# Patient Record
Sex: Female | Born: 1950 | Race: White | Hispanic: No | Marital: Married | Smoking: Never smoker
Health system: Southern US, Community
[De-identification: ages and names within clinical notes are randomized; demographics above are authoritative.]

## PROBLEM LIST (undated history)

## (undated) DIAGNOSIS — E785 Hyperlipidemia, unspecified: Secondary | ICD-10-CM

## (undated) DIAGNOSIS — B009 Herpesviral infection, unspecified: Secondary | ICD-10-CM

## (undated) DIAGNOSIS — R51 Headache: Secondary | ICD-10-CM

## (undated) DIAGNOSIS — M1611 Unilateral primary osteoarthritis, right hip: Secondary | ICD-10-CM

## (undated) DIAGNOSIS — F419 Anxiety disorder, unspecified: Secondary | ICD-10-CM

## (undated) DIAGNOSIS — H269 Unspecified cataract: Secondary | ICD-10-CM

## (undated) DIAGNOSIS — S83231A Complex tear of medial meniscus, current injury, right knee, initial encounter: Secondary | ICD-10-CM

## (undated) DIAGNOSIS — S83106A Unspecified dislocation of unspecified knee, initial encounter: Secondary | ICD-10-CM

## (undated) DIAGNOSIS — Z98811 Dental restoration status: Secondary | ICD-10-CM

## (undated) DIAGNOSIS — M810 Age-related osteoporosis without current pathological fracture: Secondary | ICD-10-CM

## (undated) DIAGNOSIS — Z8489 Family history of other specified conditions: Secondary | ICD-10-CM

## (undated) DIAGNOSIS — M1711 Unilateral primary osteoarthritis, right knee: Secondary | ICD-10-CM

## (undated) DIAGNOSIS — I10 Essential (primary) hypertension: Secondary | ICD-10-CM

## (undated) DIAGNOSIS — U071 COVID-19: Secondary | ICD-10-CM

## (undated) DIAGNOSIS — G500 Trigeminal neuralgia: Secondary | ICD-10-CM

## (undated) DIAGNOSIS — Z973 Presence of spectacles and contact lenses: Secondary | ICD-10-CM

## (undated) HISTORY — DX: Hyperlipidemia, unspecified: E78.5

## (undated) HISTORY — PX: JOINT REPLACEMENT: SHX530

## (undated) HISTORY — DX: Age-related osteoporosis without current pathological fracture: M81.0

## (undated) HISTORY — PX: COLONOSCOPY: SHX174

## (undated) HISTORY — DX: Unspecified cataract: H26.9

## (undated) HISTORY — PX: BRAIN SURGERY: SHX531

## (undated) HISTORY — PX: TUBAL LIGATION: SHX77

## (undated) HISTORY — PX: OTHER SURGICAL HISTORY: SHX169

---

## 1984-01-04 HISTORY — PX: ABDOMINAL HYSTERECTOMY: SHX81

## 2004-04-03 HISTORY — PX: TRIGEMINAL NERVE DECOMPRESSION: SHX2579

## 2011-12-04 DIAGNOSIS — S83106A Unspecified dislocation of unspecified knee, initial encounter: Secondary | ICD-10-CM

## 2011-12-04 HISTORY — DX: Unspecified dislocation of unspecified knee, initial encounter: S83.106A

## 2011-12-23 ENCOUNTER — Encounter (HOSPITAL_BASED_OUTPATIENT_CLINIC_OR_DEPARTMENT_OTHER): Payer: Self-pay | Admitting: *Deleted

## 2011-12-29 ENCOUNTER — Other Ambulatory Visit: Payer: Self-pay | Admitting: Orthopedic Surgery

## 2011-12-30 ENCOUNTER — Ambulatory Visit (HOSPITAL_BASED_OUTPATIENT_CLINIC_OR_DEPARTMENT_OTHER): Payer: Commercial Managed Care - PPO | Admitting: Anesthesiology

## 2011-12-30 ENCOUNTER — Encounter (HOSPITAL_BASED_OUTPATIENT_CLINIC_OR_DEPARTMENT_OTHER): Payer: Self-pay | Admitting: Anesthesiology

## 2011-12-30 ENCOUNTER — Encounter (HOSPITAL_BASED_OUTPATIENT_CLINIC_OR_DEPARTMENT_OTHER)
Admission: RE | Disposition: A | Discharge status: Home / Self Care | Payer: Self-pay | Source: Ambulatory Visit | Attending: Orthopedic Surgery

## 2011-12-30 ENCOUNTER — Encounter (HOSPITAL_BASED_OUTPATIENT_CLINIC_OR_DEPARTMENT_OTHER): Payer: Self-pay | Admitting: Orthopedic Surgery

## 2011-12-30 ENCOUNTER — Ambulatory Visit (HOSPITAL_BASED_OUTPATIENT_CLINIC_OR_DEPARTMENT_OTHER)
Admission: RE | Admit: 2011-12-30 | Discharge: 2011-12-30 | Disposition: A | Discharge status: Home / Self Care | Payer: Commercial Managed Care - PPO | Source: Ambulatory Visit | Attending: Orthopedic Surgery | Admitting: Orthopedic Surgery

## 2011-12-30 ENCOUNTER — Encounter (HOSPITAL_BASED_OUTPATIENT_CLINIC_OR_DEPARTMENT_OTHER): Payer: Self-pay | Admitting: *Deleted

## 2011-12-30 DIAGNOSIS — M9420 Chondromalacia, unspecified site: Secondary | ICD-10-CM | POA: Insufficient documentation

## 2011-12-30 DIAGNOSIS — M23329 Other meniscus derangements, posterior horn of medial meniscus, unspecified knee: Secondary | ICD-10-CM | POA: Insufficient documentation

## 2011-12-30 DIAGNOSIS — M1711 Unilateral primary osteoarthritis, right knee: Secondary | ICD-10-CM | POA: Diagnosis present

## 2011-12-30 DIAGNOSIS — Z90710 Acquired absence of both cervix and uterus: Secondary | ICD-10-CM | POA: Insufficient documentation

## 2011-12-30 DIAGNOSIS — S83231A Complex tear of medial meniscus, current injury, right knee, initial encounter: Secondary | ICD-10-CM | POA: Diagnosis present

## 2011-12-30 HISTORY — DX: Complex tear of medial meniscus, current injury, right knee, initial encounter: S83.231A

## 2011-12-30 HISTORY — DX: Dental restoration status: Z98.811

## 2011-12-30 HISTORY — DX: Unilateral primary osteoarthritis, right knee: M17.11

## 2011-12-30 HISTORY — PX: KNEE ARTHROSCOPY WITH MEDIAL MENISECTOMY: SHX5651

## 2011-12-30 HISTORY — DX: Headache: R51

## 2011-12-30 HISTORY — DX: Unspecified dislocation of unspecified knee, initial encounter: S83.106A

## 2011-12-30 SURGERY — ARTHROSCOPY, KNEE, WITH MEDIAL MENISCECTOMY
Anesthesia: General | Site: Knee | Laterality: Right | Wound class: Clean

## 2011-12-30 MED ORDER — MIDAZOLAM HCL 5 MG/5ML IJ SOLN
INTRAMUSCULAR | Status: DC | PRN
  Administered 2011-12-30: 2 mg via INTRAVENOUS

## 2011-12-30 MED ORDER — KETOROLAC TROMETHAMINE 10 MG PO TABS: 10.0000 mg | ORAL_TABLET | Freq: Four times a day (QID) | ORAL | Status: DC | PRN

## 2011-12-30 MED ORDER — CEFAZOLIN SODIUM-DEXTROSE 2-3 GM-% IV SOLR
2.0000 g | INTRAVENOUS | Status: AC
  Administered 2011-12-30: 2 g via INTRAVENOUS

## 2011-12-30 MED ORDER — LACTATED RINGERS IV SOLN
INTRAVENOUS | Status: DC
Start: 2011-12-30 — End: 2011-12-30
  Administered 2011-12-30 (×2): via INTRAVENOUS

## 2011-12-30 MED ORDER — OXYCODONE-ACETAMINOPHEN 5-325 MG PO TABS: 1.0000 | ORAL_TABLET | Freq: Four times a day (QID) | ORAL | Status: DC | PRN

## 2011-12-30 MED ORDER — ONDANSETRON HCL 4 MG/2ML IJ SOLN
INTRAMUSCULAR | Status: DC | PRN
  Administered 2011-12-30: 4 mg via INTRAVENOUS

## 2011-12-30 MED ORDER — KETOROLAC TROMETHAMINE 30 MG/ML IJ SOLN
30.0000 mg | Freq: Once | INTRAMUSCULAR | Status: AC
  Administered 2011-12-30: 30 mg via INTRAVENOUS

## 2011-12-30 MED ORDER — FENTANYL CITRATE 0.05 MG/ML IJ SOLN: 50.0000 ug | INTRAMUSCULAR | Status: DC | PRN

## 2011-12-30 MED ORDER — OXYCODONE HCL 5 MG PO TABS: 5.0000 mg | ORAL_TABLET | Freq: Once | ORAL | Status: DC | PRN

## 2011-12-30 MED ORDER — PROMETHAZINE HCL 25 MG PO TABS: 25.0000 mg | ORAL_TABLET | Freq: Four times a day (QID) | ORAL | Status: DC | PRN

## 2011-12-30 MED ORDER — DEXAMETHASONE SODIUM PHOSPHATE 4 MG/ML IJ SOLN
INTRAMUSCULAR | Status: DC | PRN
  Administered 2011-12-30: 10 mg via INTRAVENOUS

## 2011-12-30 MED ORDER — FENTANYL CITRATE 0.05 MG/ML IJ SOLN
INTRAMUSCULAR | Status: DC | PRN
  Administered 2011-12-30: 100 ug via INTRAVENOUS

## 2011-12-30 MED ORDER — MIDAZOLAM HCL 2 MG/2ML IJ SOLN: 1.0000 mg | INTRAMUSCULAR | Status: DC | PRN

## 2011-12-30 MED ORDER — HYDROMORPHONE HCL PF 1 MG/ML IJ SOLN
0.2500 mg | INTRAMUSCULAR | Status: DC | PRN
  Administered 2011-12-30 (×3): 0.5 mg via INTRAVENOUS

## 2011-12-30 MED ORDER — OXYCODONE HCL 5 MG/5ML PO SOLN: 5.0000 mg | Freq: Once | ORAL | Status: DC | PRN

## 2011-12-30 MED ORDER — BUPIVACAINE HCL (PF) 0.5 % IJ SOLN
INTRAMUSCULAR | Status: DC | PRN
  Administered 2011-12-30: 20 mL

## 2011-12-30 MED ORDER — PROPOFOL 10 MG/ML IV BOLUS
INTRAVENOUS | Status: DC | PRN
  Administered 2011-12-30: 300 mg via INTRAVENOUS

## 2011-12-30 MED ORDER — SODIUM CHLORIDE 0.9 % IR SOLN
Status: DC | PRN
  Administered 2011-12-30: 3000 mL

## 2011-12-30 MED ORDER — ONDANSETRON HCL 4 MG/2ML IJ SOLN: 4.0000 mg | Freq: Once | INTRAMUSCULAR | Status: DC | PRN

## 2011-12-30 MED ORDER — LIDOCAINE HCL (CARDIAC) 20 MG/ML IV SOLN
INTRAVENOUS | Status: DC | PRN
  Administered 2011-12-30: 75 mg via INTRAVENOUS

## 2011-12-30 MED ORDER — SENNA-DOCUSATE SODIUM 8.6-50 MG PO TABS: 1.0000 | ORAL_TABLET | Freq: Every day | ORAL | Status: DC

## 2011-12-30 MED ORDER — EPHEDRINE SULFATE 50 MG/ML IJ SOLN
INTRAMUSCULAR | Status: DC | PRN
  Administered 2011-12-30: 10 mg via INTRAVENOUS

## 2011-12-30 SURGICAL SUPPLY — 37 items
BANDAGE ELASTIC 6 VELCRO ST LF (GAUZE/BANDAGES/DRESSINGS) ×2 IMPLANT
BANDAGE ESMARK 6X9 LF (GAUZE/BANDAGES/DRESSINGS) IMPLANT
BENZOIN TINCTURE PRP APPL 2/3 (GAUZE/BANDAGES/DRESSINGS) ×2 IMPLANT
BLADE CUTTER GATOR 3.5 (BLADE) ×2 IMPLANT
BNDG ESMARK 6X9 LF (GAUZE/BANDAGES/DRESSINGS)
CANISTER OMNI JUG 16 LITER (MISCELLANEOUS) ×2 IMPLANT
CANISTER SUCTION 2500CC (MISCELLANEOUS) IMPLANT
CLOTH BEACON ORANGE TIMEOUT ST (SAFETY) ×2 IMPLANT
CUFF TOURNIQUET SINGLE 34IN LL (TOURNIQUET CUFF) IMPLANT
CUTTER KNOT PUSHER 2-0 FIBERWI (INSTRUMENTS) IMPLANT
CUTTER MENISCUS  4.2MM (BLADE)
CUTTER MENISCUS 4.2MM (BLADE) IMPLANT
DRAPE ARTHROSCOPY W/POUCH 90 (DRAPES) ×2 IMPLANT
DURAPREP 26ML APPLICATOR (WOUND CARE) ×2 IMPLANT
ELECT MENISCUS 165MM 90D (ELECTRODE) IMPLANT
ELECT REM PT RETURN 9FT ADLT (ELECTROSURGICAL)
ELECTRODE REM PT RTRN 9FT ADLT (ELECTROSURGICAL) IMPLANT
GLOVE BIO SURGEON STRL SZ8 (GLOVE) ×2 IMPLANT
GLOVE BIOGEL PI IND STRL 8 (GLOVE) ×2 IMPLANT
GLOVE BIOGEL PI INDICATOR 8 (GLOVE) ×2
GLOVE ORTHO TXT STRL SZ7.5 (GLOVE) ×2 IMPLANT
GOWN BRE IMP PREV XXLGXLNG (GOWN DISPOSABLE) ×4 IMPLANT
HOLDER KNEE FOAM BLUE (MISCELLANEOUS) ×2 IMPLANT
KNEE WRAP E Z 3 GEL PACK (MISCELLANEOUS) ×2 IMPLANT
PACK ARTHROSCOPY DSU (CUSTOM PROCEDURE TRAY) ×2 IMPLANT
PACK BASIN DAY SURGERY FS (CUSTOM PROCEDURE TRAY) ×2 IMPLANT
PENCIL BUTTON HOLSTER BLD 10FT (ELECTRODE) IMPLANT
SET ARTHROSCOPY TUBING (MISCELLANEOUS) ×1
SET ARTHROSCOPY TUBING LN (MISCELLANEOUS) ×1 IMPLANT
SLEEVE SCD COMPRESS KNEE MED (MISCELLANEOUS) IMPLANT
SPONGE GAUZE 4X4 12PLY (GAUZE/BANDAGES/DRESSINGS) ×2 IMPLANT
STRIP CLOSURE SKIN 1/2X4 (GAUZE/BANDAGES/DRESSINGS) ×2 IMPLANT
SUT MNCRL AB 4-0 PS2 18 (SUTURE) ×2 IMPLANT
TOWEL OR 17X24 6PK STRL BLUE (TOWEL DISPOSABLE) ×2 IMPLANT
TOWEL OR NON WOVEN STRL DISP B (DISPOSABLE) ×2 IMPLANT
WAND STAR VAC 90 (SURGICAL WAND) IMPLANT
WATER STERILE IRR 1000ML POUR (IV SOLUTION) ×2 IMPLANT

## 2011-12-30 NOTE — Anesthesia Postprocedure Evaluation (Signed)
  Anesthesia Post-op Note  Patient: Laura Wright  Procedure(s) Performed: Procedure(s) (LRB) with comments: KNEE ARTHROSCOPY WITH MEDIAL MENISECTOMY (Right) - RIGHT KNEE SCOPE Partial MEDIAL MENISCECTOMY  Patient Location: PACU  Anesthesia Type:General  Level of Consciousness: awake, alert  and oriented  Airway and Oxygen Therapy: Patient Spontanous Breathing and Patient connected to face mask oxygen  Post-op Pain: mild  Post-op Assessment: Post-op Vital signs reviewed  Post-op Vital Signs: Reviewed  Complications: No apparent anesthesia complications

## 2011-12-30 NOTE — Op Note (Signed)
12/30/2011  9:00 AM  PATIENT:  [name]    PRE-OPERATIVE DIAGNOSIS:  Right knee medial meniscus tear, early degenerative changes  POST-OPERATIVE DIAGNOSIS:  Right knee complex medial meniscus tear from the posterior horn to the mid substance of the body with grade 4 chondral loss on the femur and the tibia on the medial side.  PROCEDURE:  KNEE ARTHROSCOPY WITH MEDIAL MENISECTOMY  SURGEON:  Eulas Post, MD  PHYSICIAN ASSISTANT: Janace Litten, OPA-C, present and scrubbed throughout the case, critical for completion in a timely fashion, and for retraction, instrumentation, and closure.  ANESTHESIA:   General  PREOPERATIVE INDICATIONS:  Laura Wright is a  61 y.o. female with a diagnosis of right knee medial meniscus tear who failed conservative treatment. We did discuss the option of possible medial compartment arthroplasty given the fact that there were degenerative changes seen on the MRI and on the plain films, however she elected for arthroscopic intervention in hopes of putting off knee arthroplasty. She failed conservative measures including injections, bracing, activity modification, and anti-inflammatories, and elected for surgical management.    The risks benefits and alternatives were discussed with the patient preoperatively including but not limited to the risks of infection, bleeding, nerve injury, cardiopulmonary complications, the need for revision surgery, among others, and the patient was willing to proceed.  OPERATIVE IMPLANTS: None  OPERATIVE FINDINGS: The undersurface of the patella was reasonably well maintained. The femoral trochlea was intact with some grade 1 changes. The knee overall was fairly tight. The medial compartment had a complex tear the mid substance of the body of the medial meniscus, with significant degenerative tissue. There was extensive uncontained grade 4 chondral loss on the tibia as well as a matching lesion on the femur. The anterior  cruciate ligament was intact and took on reasonably good tension. The lateral compartment was intact with only some very minimal central fraying of the meniscus, which was not even easily accessed because the knee was so tight, and was not debrided.  OPERATIVE PROCEDURE: The patient was brought to the operating room and placed in supine position. General anesthesia was administered. IV antibiotics were given. The right lower extremity was prepped and draped in usual sterile fashion. Time out was performed. Diagnostic arthroscopy was carried out with the above-named findings. I used the arthroscopic scopic shaver to debride the meniscus. I didn't even need to basket because the meniscal tissue was so degenerative it was easily removed with the shaver. Once I had completed the mastectomy, and removed any chondral debris, the instruments were removed, and the knee injected and the portals closed with Monocryl followed by Steri-Strips and sterile gauze. She was awakened and returned to the PACU in stable and satisfactory condition. There were no complications.

## 2011-12-30 NOTE — Transfer of Care (Signed)
Immediate Anesthesia Transfer of Care Note  Patient: Laura Wright  Procedure(s) Performed: Procedure(s) (LRB) with comments: KNEE ARTHROSCOPY WITH MEDIAL MENISECTOMY (Right) - RIGHT KNEE SCOPE Partial MEDIAL MENISCECTOMY  Patient Location: PACU  Anesthesia Type:General  Level of Consciousness: awake, alert  and oriented  Airway & Oxygen Therapy: Patient Spontanous Breathing and Patient connected to nasal cannula oxygen  Post-op Assessment: Report given to PACU RN and Post -op Vital signs reviewed and stable  Post vital signs: Reviewed and stable  Complications: No apparent anesthesia complications

## 2011-12-30 NOTE — Anesthesia Preprocedure Evaluation (Signed)

## 2011-12-30 NOTE — Anesthesia Procedure Notes (Signed)
Procedure Name: LMA Insertion Date/Time: 12/30/2011 8:26 AM Performed by: Zenia Resides D Pre-anesthesia Checklist: Patient identified, Emergency Drugs available, Suction available and Patient being monitored Patient Re-evaluated:Patient Re-evaluated prior to inductionOxygen Delivery Method: Circle System Utilized Preoxygenation: Pre-oxygenation with 100% oxygen Intubation Type: IV induction Ventilation: Mask ventilation without difficulty LMA: LMA inserted LMA Size: 4.0 Number of attempts: 1 Airway Equipment and Method: bite block Placement Confirmation: positive ETCO2 and breath sounds checked- equal and bilateral Tube secured with: Tape Dental Injury: Teeth and Oropharynx as per pre-operative assessment

## 2011-12-30 NOTE — H&P (Signed)
PREOPERATIVE H&P  Chief Complaint: RIGHT KNEE; TEAR OF MEDIAL CARTILAGE OR MENISCUS OF KNEE CURRENT  HPI: Laura Wright is a 61 y.o. female who presents for preoperative history and physical with a diagnosis of RIGHT KNEE; TEAR OF MEDIAL CARTILAGE OR MENISCUS OF KNEE CURRENT. Symptoms are rated as moderate to severe, and have been worsening.  This is significantly impairing activities of daily living.  She has elected for surgical management. We have tried injections, bracing, therapy/home exercise program, anti-inflammatories without relief.  Past Medical History  Diagnosis Date  . Headache     due to trigeminal neuralgia and trauma of surgery  . Knee dislocation 12/2011    right  . Dental crowns present     x 2   Past Surgical History  Procedure Date  . Abdominal hysterectomy 1986    partial  . Trigeminal nerve decompression 04/2004   History   Social History  . Marital Status: Married    Spouse Name: N/A    Number of Children: N/A  . Years of Education: N/A   Social History Main Topics  . Smoking status: Never Smoker   . Smokeless tobacco: Never Used  . Alcohol Use: No  . Drug Use: No  . Sexually Active:    Other Topics Concern  . None   Social History Narrative  . None   Family History  Problem Relation Age of Onset  . Anesthesia problems Daughter     post-op N/V   Allergies  Allergen Reactions  . Macrodantin (Nitrofurantoin Macrocrystal) Rash   Prior to Admission medications   Medication Sig Start Date End Date Taking? Authorizing Provider  calcium carbonate (OS-CAL) 600 MG TABS Take 600 mg by mouth 2 (two) times daily with a meal.   Yes Historical Provider, MD  Multiple Vitamin (MULTIVITAMIN) tablet Take 1 tablet by mouth daily.   Yes Historical Provider, MD  thiamine (VITAMIN B-1) 100 MG tablet Take 100 mg by mouth daily.   Yes Historical Provider, MD  valACYclovir (VALTREX) 500 MG tablet Take 500 mg by mouth 2 (two) times daily.   Yes Historical  Provider, MD  vitamin C (ASCORBIC ACID) 500 MG tablet Take 500 mg by mouth daily.   Yes Historical Provider, MD  vitamin E 100 UNIT capsule Take 100 Units by mouth daily.   Yes Historical Provider, MD     Positive ROS: All other systems have been reviewed and were otherwise negative with the exception of those mentioned in the HPI and as above.  Physical Exam: General: Alert, no acute distress Cardiovascular: No pedal edema Respiratory: No cyanosis, no use of accessory musculature GI: No organomegaly, abdomen is soft and non-tender Skin: No lesions in the area of chief complaint Neurologic: Sensation intact distally Psychiatric: Patient is competent for consent with normal mood and affect Lymphatic: No axillary or cervical lymphadenopathy  MUSCULOSKELETAL: Right knee has positive medial joint line pain. Minimal effusion. Minimal pain laterally. No true locking.  Assessment: Right knee medial meniscus tear  Plan: Plan for Procedure(s): KNEE ARTHROSCOPY WITH MEDIAL MENISECTOMY  The risks benefits and alternatives were discussed with the patient including but not limited to the risks of nonoperative treatment, versus surgical intervention including infection, bleeding, nerve injury,  blood clots, cardiopulmonary complications, morbidity, mortality, among others, and they were willing to proceed. We have also discussed the risks for progression of arthritis, incomplete relief of symptoms, persistent pain, among others.  Eulas Post, MD Cell [phone] Pager [phone]  12/30/2011  7:00 AM

## 2012-01-02 ENCOUNTER — Encounter (HOSPITAL_BASED_OUTPATIENT_CLINIC_OR_DEPARTMENT_OTHER): Payer: Self-pay | Admitting: Orthopedic Surgery

## 2012-12-18 ENCOUNTER — Encounter (HOSPITAL_COMMUNITY): Payer: Self-pay | Admitting: Pharmacy Technician

## 2012-12-25 ENCOUNTER — Encounter (HOSPITAL_COMMUNITY): Payer: Self-pay

## 2012-12-25 ENCOUNTER — Ambulatory Visit (HOSPITAL_COMMUNITY)
Admission: RE | Admit: 2012-12-25 | Discharge: 2012-12-25 | Disposition: A | Discharge status: Home / Self Care | Payer: Commercial Managed Care - PPO | Source: Ambulatory Visit | Attending: Orthopedic Surgery | Admitting: Orthopedic Surgery

## 2012-12-25 ENCOUNTER — Other Ambulatory Visit (HOSPITAL_COMMUNITY): Payer: Self-pay | Admitting: *Deleted

## 2012-12-25 ENCOUNTER — Encounter (HOSPITAL_COMMUNITY)
Admission: RE | Admit: 2012-12-25 | Discharge: 2012-12-25 | Disposition: A | Discharge status: Home / Self Care | Payer: Commercial Managed Care - PPO | Source: Ambulatory Visit | Attending: Orthopedic Surgery | Admitting: Orthopedic Surgery

## 2012-12-25 DIAGNOSIS — Z01812 Encounter for preprocedural laboratory examination: Secondary | ICD-10-CM | POA: Insufficient documentation

## 2012-12-25 DIAGNOSIS — Z01810 Encounter for preprocedural cardiovascular examination: Secondary | ICD-10-CM | POA: Insufficient documentation

## 2012-12-25 HISTORY — DX: Essential (primary) hypertension: I10

## 2012-12-25 HISTORY — DX: Trigeminal neuralgia: G50.0

## 2012-12-25 HISTORY — DX: Herpesviral infection, unspecified: B00.9

## 2012-12-25 HISTORY — DX: Anxiety disorder, unspecified: F41.9

## 2012-12-25 LAB — BASIC METABOLIC PANEL
BUN: 11 mg/dL (ref 6–23)
CO2: 28 mEq/L (ref 19–32)
Chloride: 101 mEq/L (ref 96–112)
Creatinine, Ser: 0.88 mg/dL (ref 0.50–1.10)
Glucose, Bld: 89 mg/dL (ref 70–99)

## 2012-12-25 LAB — CBC
HCT: 42.5 % (ref 36.0–46.0)
MCHC: 32.9 g/dL (ref 30.0–36.0)
MCV: 87.4 fL (ref 78.0–100.0)
RDW: 13.5 % (ref 11.5–15.5)

## 2012-12-25 LAB — ABO/RH: ABO/RH(D): O POS

## 2012-12-25 LAB — TYPE AND SCREEN: ABO/RH(D): O POS

## 2012-12-25 NOTE — Progress Notes (Signed)
No orders in EPIC from Dr. Dion Saucier. Called his office and spoke with Cordelia Pen and she states she will get a message to him.

## 2012-12-25 NOTE — Pre-Procedure Instructions (Signed)
Laura Wright  12/25/2012   Your procedure is scheduled on:  Tuesday, January 01, 2013 at 7:30 AM.   Report to [facility] Entrance "A" Admitting Office at 5:30 AM.   Call this number if you have problems the morning of surgery: [phone]   Remember:   Do not eat food or drink liquids after midnight Monday, 12/31/12.   Take these medicines the morning of surgery with A SIP OF WATER: valACYclovir (VALTREX), clonazePAM (KLONOPIN) - if needed.  Stop Vitamin E and Toradol (Ketorolac) as of today, 12/25/12.     Do not wear jewelry, make-up or nail polish.  Do not wear lotions, powders, or perfumes. You may wear deodorant.  Do not shave 48 hours prior to surgery.   Do not bring valuables to the hospital.  [facility] is not responsible                  for any belongings or valuables.               Contacts, dentures or bridgework may not be worn into surgery.  Leave suitcase in the car. After surgery it may be brought to your room.  For patients admitted to the hospital, discharge time is determined by your                treatment team.         Special Instructions: Shower using CHG 2 nights before surgery and the night before surgery.  If you shower the day of surgery use CHG.  Use special wash - you have one bottle of CHG for all showers.  You should use approximately 1/3 of the bottle for each shower.   Please read over the following fact sheets that you were given: Pain Booklet, Coughing and Deep Breathing, Blood Transfusion Information, MRSA Information and Surgical Site Infection Prevention

## 2012-12-26 NOTE — Progress Notes (Signed)
Anesthesia Chart Review:  Patient is a 62 year old female scheduled for unicompartmental knee, right on 01/01/13 by Dr. Dion Saucier.  History includes non-smoker, HTN, anxiety, trigeminal neuralgia, headaches, hysterectomy. PCP is listed as Dr. Brooke Bonito.  EKG on 12/25/12 showed NSR, low voltage QRS, cannot rule out anterior infarct (age undetermined).  Currently, there are no comparison EKGs available. No CV symptoms were documented at her PAT visit.  Preoperative CXR and labs noted. Orders were pending at that time of her PAT visit, so additional lab orders, if any, will have to be done on the day of surgery.  She will be evaluated by her assigned anesthesiologist on the day of surgery.  If no acute changes or new CV symptomology then I would anticipate that she could proceed as planned.  Velna Ochs [facility] Short Stay Center/Anesthesiology Phone [phone] 12/26/2012 10:04 AM

## 2012-12-31 ENCOUNTER — Other Ambulatory Visit: Payer: Self-pay | Admitting: Orthopedic Surgery

## 2012-12-31 MED ORDER — CEFAZOLIN SODIUM-DEXTROSE 2-3 GM-% IV SOLR
2.0000 g | INTRAVENOUS | Status: AC
  Administered 2013-01-01: 2 g via INTRAVENOUS
  Filled 2012-12-31: qty 50

## 2012-12-31 NOTE — Progress Notes (Signed)
Spoke with Tresa Endo at Dr. Shelba Flake office to request orders.

## 2013-01-01 ENCOUNTER — Encounter (HOSPITAL_COMMUNITY): Payer: Self-pay | Admitting: *Deleted

## 2013-01-01 ENCOUNTER — Encounter (HOSPITAL_COMMUNITY): Payer: Commercial Managed Care - PPO | Admitting: Vascular Surgery

## 2013-01-01 ENCOUNTER — Inpatient Hospital Stay (HOSPITAL_COMMUNITY): Payer: Commercial Managed Care - PPO

## 2013-01-01 ENCOUNTER — Inpatient Hospital Stay (HOSPITAL_COMMUNITY): Payer: Commercial Managed Care - PPO | Admitting: Anesthesiology

## 2013-01-01 ENCOUNTER — Inpatient Hospital Stay (HOSPITAL_COMMUNITY)
Admission: RE | Admit: 2013-01-01 | Discharge: 2013-01-02 | DRG: 470 | Disposition: A | Discharge status: Home Health | Payer: Commercial Managed Care - PPO | Source: Ambulatory Visit | Attending: Orthopedic Surgery | Admitting: Orthopedic Surgery

## 2013-01-01 ENCOUNTER — Encounter (HOSPITAL_COMMUNITY)
Admission: RE | Disposition: A | Discharge status: Home Health | Payer: Self-pay | Source: Ambulatory Visit | Attending: Orthopedic Surgery

## 2013-01-01 DIAGNOSIS — M1710 Unilateral primary osteoarthritis, unspecified knee: Principal | ICD-10-CM | POA: Diagnosis present

## 2013-01-01 DIAGNOSIS — F411 Generalized anxiety disorder: Secondary | ICD-10-CM | POA: Diagnosis present

## 2013-01-01 DIAGNOSIS — M179 Osteoarthritis of knee, unspecified: Secondary | ICD-10-CM | POA: Diagnosis present

## 2013-01-01 DIAGNOSIS — Z79899 Other long term (current) drug therapy: Secondary | ICD-10-CM

## 2013-01-01 DIAGNOSIS — M1711 Unilateral primary osteoarthritis, right knee: Secondary | ICD-10-CM

## 2013-01-01 DIAGNOSIS — I10 Essential (primary) hypertension: Secondary | ICD-10-CM | POA: Diagnosis present

## 2013-01-01 HISTORY — PX: PARTIAL KNEE ARTHROPLASTY: SHX2174

## 2013-01-01 LAB — CBC
Hemoglobin: 13 g/dL (ref 12.0–15.0)
MCHC: 32.8 g/dL (ref 30.0–36.0)
Platelets: 267 10*3/uL (ref 150–400)
RBC: 4.55 MIL/uL (ref 3.87–5.11)
WBC: 12.6 10*3/uL — ABNORMAL HIGH (ref 4.0–10.5)

## 2013-01-01 LAB — CREATININE, SERUM
Creatinine, Ser: 0.88 mg/dL (ref 0.50–1.10)
GFR calc non Af Amer: 69 mL/min — ABNORMAL LOW (ref >90)

## 2013-01-01 SURGERY — ARTHROPLASTY, KNEE, UNICOMPARTMENTAL
Anesthesia: General | Site: Knee | Laterality: Right

## 2013-01-01 MED ORDER — METHOCARBAMOL 500 MG PO TABS: 500.0000 mg | ORAL_TABLET | Freq: Four times a day (QID) | ORAL | Status: DC

## 2013-01-01 MED ORDER — ONDANSETRON HCL 4 MG PO TABS: 4.0000 mg | ORAL_TABLET | Freq: Four times a day (QID) | ORAL | Status: DC | PRN

## 2013-01-01 MED ORDER — VALACYCLOVIR HCL 500 MG PO TABS
500.0000 mg | ORAL_TABLET | Freq: Every day | ORAL | Status: DC
  Administered 2013-01-02: 500 mg via ORAL
  Filled 2013-01-01 (×2): qty 1

## 2013-01-01 MED ORDER — ROPIVACAINE HCL 5 MG/ML IJ SOLN
INTRAMUSCULAR | Status: DC | PRN
  Administered 2013-01-01: 25 mL via PERINEURAL

## 2013-01-01 MED ORDER — ENOXAPARIN SODIUM 30 MG/0.3ML ~~LOC~~ SOLN: 30.0000 mg | Freq: Two times a day (BID) | SUBCUTANEOUS | Status: DC

## 2013-01-01 MED ORDER — POTASSIUM CHLORIDE IN NACL 20-0.45 MEQ/L-% IV SOLN
INTRAVENOUS | Status: DC
  Administered 2013-01-01: 21:00:00 via INTRAVENOUS
  Filled 2013-01-01 (×6): qty 1000

## 2013-01-01 MED ORDER — MENTHOL 3 MG MT LOZG: 1.0000 | LOZENGE | OROMUCOSAL | Status: DC | PRN

## 2013-01-01 MED ORDER — POLYETHYLENE GLYCOL 3350 17 G PO PACK: 17.0000 g | PACK | Freq: Every day | ORAL | Status: DC | PRN

## 2013-01-01 MED ORDER — DOCUSATE SODIUM 100 MG PO CAPS
100.0000 mg | ORAL_CAPSULE | Freq: Two times a day (BID) | ORAL | Status: DC
  Administered 2013-01-01 – 2013-01-02 (×2): 100 mg via ORAL
  Filled 2013-01-01 (×3): qty 1

## 2013-01-01 MED ORDER — DEXAMETHASONE SODIUM PHOSPHATE 10 MG/ML IJ SOLN
10.0000 mg | Freq: Three times a day (TID) | INTRAMUSCULAR | Status: AC
  Filled 2013-01-01 (×3): qty 1

## 2013-01-01 MED ORDER — EPHEDRINE SULFATE 50 MG/ML IJ SOLN
INTRAMUSCULAR | Status: DC | PRN
  Administered 2013-01-01: 10 mg via INTRAVENOUS

## 2013-01-01 MED ORDER — HYDROCHLOROTHIAZIDE 12.5 MG PO CAPS
12.5000 mg | ORAL_CAPSULE | Freq: Every day | ORAL | Status: DC
  Administered 2013-01-02: 12.5 mg via ORAL
  Filled 2013-01-01 (×2): qty 1

## 2013-01-01 MED ORDER — LACTATED RINGERS IV SOLN
INTRAVENOUS | Status: DC | PRN
  Administered 2013-01-01 (×2): via INTRAVENOUS

## 2013-01-01 MED ORDER — OXYCODONE-ACETAMINOPHEN 10-325 MG PO TABS: 1.0000 | ORAL_TABLET | Freq: Four times a day (QID) | ORAL | Status: DC | PRN

## 2013-01-01 MED ORDER — CLONAZEPAM 0.5 MG PO TABS: 0.5000 mg | ORAL_TABLET | Freq: Three times a day (TID) | ORAL | Status: DC | PRN

## 2013-01-01 MED ORDER — SENNA 8.6 MG PO TABS
1.0000 | ORAL_TABLET | Freq: Two times a day (BID) | ORAL | Status: DC
  Administered 2013-01-01 – 2013-01-02 (×2): 8.6 mg via ORAL
  Filled 2013-01-01 (×4): qty 1

## 2013-01-01 MED ORDER — ALUM & MAG HYDROXIDE-SIMETH 200-200-20 MG/5ML PO SUSP: 30.0000 mL | ORAL | Status: DC | PRN

## 2013-01-01 MED ORDER — HYDROMORPHONE HCL PF 1 MG/ML IJ SOLN
INTRAMUSCULAR | Status: AC
  Filled 2013-01-01: qty 1

## 2013-01-01 MED ORDER — METHOCARBAMOL 500 MG PO TABS
ORAL_TABLET | ORAL | Status: AC
  Administered 2013-01-01: 500 mg
  Filled 2013-01-01: qty 1

## 2013-01-01 MED ORDER — OXYCODONE HCL 5 MG PO TABS
5.0000 mg | ORAL_TABLET | ORAL | Status: DC | PRN
  Administered 2013-01-01 – 2013-01-02 (×6): 10 mg via ORAL
  Filled 2013-01-01 (×6): qty 2

## 2013-01-01 MED ORDER — CEFAZOLIN SODIUM-DEXTROSE 2-3 GM-% IV SOLR
2.0000 g | Freq: Four times a day (QID) | INTRAVENOUS | Status: AC
  Administered 2013-01-01: 2 g via INTRAVENOUS
  Filled 2013-01-01 (×2): qty 50

## 2013-01-01 MED ORDER — HYDROMORPHONE HCL PF 1 MG/ML IJ SOLN
0.2500 mg | INTRAMUSCULAR | Status: DC | PRN
  Administered 2013-01-01 (×3): 0.5 mg via INTRAVENOUS

## 2013-01-01 MED ORDER — LISINOPRIL-HYDROCHLOROTHIAZIDE 10-12.5 MG PO TABS: 1.0000 | ORAL_TABLET | Freq: Every day | ORAL | Status: DC

## 2013-01-01 MED ORDER — ACETAMINOPHEN 650 MG RE SUPP: 650.0000 mg | Freq: Four times a day (QID) | RECTAL | Status: DC | PRN

## 2013-01-01 MED ORDER — DEXAMETHASONE SODIUM PHOSPHATE 4 MG/ML IJ SOLN
INTRAMUSCULAR | Status: DC | PRN
  Administered 2013-01-01: 8 mg via INTRAVENOUS

## 2013-01-01 MED ORDER — LISINOPRIL 10 MG PO TABS
10.0000 mg | ORAL_TABLET | Freq: Every day | ORAL | Status: DC
  Administered 2013-01-02: 10 mg via ORAL
  Filled 2013-01-01 (×3): qty 1

## 2013-01-01 MED ORDER — PHENOL 1.4 % MT LIQD: 1.0000 | OROMUCOSAL | Status: DC | PRN

## 2013-01-01 MED ORDER — DEXTROSE 5 % IV SOLN
INTRAVENOUS | Status: DC | PRN
  Administered 2013-01-01: 08:00:00 via INTRAVENOUS

## 2013-01-01 MED ORDER — DEXAMETHASONE 4 MG PO TABS
10.0000 mg | ORAL_TABLET | Freq: Three times a day (TID) | ORAL | Status: AC
  Administered 2013-01-01 – 2013-01-02 (×2): 10 mg via ORAL
  Filled 2013-01-01 (×4): qty 1

## 2013-01-01 MED ORDER — ZOLPIDEM TARTRATE 5 MG PO TABS: 5.0000 mg | ORAL_TABLET | Freq: Every evening | ORAL | Status: DC | PRN

## 2013-01-01 MED ORDER — LIDOCAINE HCL (CARDIAC) 20 MG/ML IV SOLN
INTRAVENOUS | Status: DC | PRN
  Administered 2013-01-01: 40 mg via INTRAVENOUS

## 2013-01-01 MED ORDER — METOCLOPRAMIDE HCL 10 MG PO TABS: 5.0000 mg | ORAL_TABLET | Freq: Three times a day (TID) | ORAL | Status: DC | PRN

## 2013-01-01 MED ORDER — SODIUM CHLORIDE 0.9 % IR SOLN
Status: DC | PRN
  Administered 2013-01-01: 3000 mL

## 2013-01-01 MED ORDER — CALCIUM CARBONATE 600 MG PO TABS: 600.0000 mg | ORAL_TABLET | Freq: Every day | ORAL | Status: DC

## 2013-01-01 MED ORDER — CALCIUM CARBONATE 1250 (500 CA) MG PO TABS
1.0000 | ORAL_TABLET | Freq: Every day | ORAL | Status: DC
  Administered 2013-01-02: 500 mg via ORAL
  Filled 2013-01-01 (×2): qty 1

## 2013-01-01 MED ORDER — VITAMIN C 500 MG PO TABS
500.0000 mg | ORAL_TABLET | Freq: Every day | ORAL | Status: DC
  Administered 2013-01-02: 500 mg via ORAL
  Filled 2013-01-01 (×2): qty 1

## 2013-01-01 MED ORDER — METHOCARBAMOL 100 MG/ML IJ SOLN
500.0000 mg | Freq: Four times a day (QID) | INTRAVENOUS | Status: DC | PRN
  Filled 2013-01-01: qty 5

## 2013-01-01 MED ORDER — ONDANSETRON HCL 4 MG/2ML IJ SOLN
INTRAMUSCULAR | Status: DC | PRN
  Administered 2013-01-01: 4 mg via INTRAVENOUS

## 2013-01-01 MED ORDER — KETOROLAC TROMETHAMINE 15 MG/ML IJ SOLN
15.0000 mg | Freq: Four times a day (QID) | INTRAMUSCULAR | Status: DC
  Administered 2013-01-02 (×3): 15 mg via INTRAVENOUS
  Filled 2013-01-01 (×3): qty 1

## 2013-01-01 MED ORDER — MAGNESIUM CITRATE PO SOLN: 1.0000 | Freq: Once | ORAL | Status: AC | PRN

## 2013-01-01 MED ORDER — OXYCODONE HCL 5 MG/5ML PO SOLN: 5.0000 mg | Freq: Once | ORAL | Status: DC | PRN

## 2013-01-01 MED ORDER — ENOXAPARIN SODIUM 30 MG/0.3ML ~~LOC~~ SOLN
30.0000 mg | Freq: Two times a day (BID) | SUBCUTANEOUS | Status: DC
  Administered 2013-01-02: 30 mg via SUBCUTANEOUS
  Filled 2013-01-01 (×3): qty 0.3

## 2013-01-01 MED ORDER — METOCLOPRAMIDE HCL 5 MG/ML IJ SOLN: 5.0000 mg | Freq: Three times a day (TID) | INTRAMUSCULAR | Status: DC | PRN

## 2013-01-01 MED ORDER — ESTRADIOL 0.52 MG/0.87 GM (0.06%) TD GEL
1.0000 "application " | Freq: Every day | TRANSDERMAL | Status: DC
  Administered 2013-01-01 – 2013-01-02 (×2): 1 via TOPICAL

## 2013-01-01 MED ORDER — FENTANYL CITRATE 0.05 MG/ML IJ SOLN
INTRAMUSCULAR | Status: DC | PRN
  Administered 2013-01-01 (×3): 50 ug via INTRAVENOUS

## 2013-01-01 MED ORDER — GLYCOPYRROLATE 0.2 MG/ML IJ SOLN
INTRAMUSCULAR | Status: DC | PRN
  Administered 2013-01-01: 0.6 mg via INTRAVENOUS

## 2013-01-01 MED ORDER — METHOCARBAMOL 500 MG PO TABS
500.0000 mg | ORAL_TABLET | Freq: Four times a day (QID) | ORAL | Status: DC | PRN
  Administered 2013-01-02 (×2): 500 mg via ORAL
  Filled 2013-01-01 (×3): qty 1

## 2013-01-01 MED ORDER — HYDROMORPHONE HCL PF 1 MG/ML IJ SOLN
INTRAMUSCULAR | Status: AC
  Administered 2013-01-01: 0.5 mg
  Filled 2013-01-01: qty 1

## 2013-01-01 MED ORDER — PROMETHAZINE HCL 25 MG PO TABS: 25.0000 mg | ORAL_TABLET | Freq: Four times a day (QID) | ORAL | Status: DC | PRN

## 2013-01-01 MED ORDER — VITAMIN B-1 100 MG PO TABS
100.0000 mg | ORAL_TABLET | Freq: Every day | ORAL | Status: DC
  Administered 2013-01-02: 10:00:00 100 mg via ORAL
  Filled 2013-01-01 (×2): qty 1

## 2013-01-01 MED ORDER — METOCLOPRAMIDE HCL 5 MG/ML IJ SOLN: 10.0000 mg | Freq: Once | INTRAMUSCULAR | Status: DC | PRN

## 2013-01-01 MED ORDER — PROPOFOL 10 MG/ML IV BOLUS
INTRAVENOUS | Status: DC | PRN
  Administered 2013-01-01: 200 mg via INTRAVENOUS

## 2013-01-01 MED ORDER — ARTIFICIAL TEARS OP OINT
TOPICAL_OINTMENT | OPHTHALMIC | Status: DC | PRN
  Administered 2013-01-01: 1 via OPHTHALMIC

## 2013-01-01 MED ORDER — BISACODYL 10 MG RE SUPP: 10.0000 mg | Freq: Every day | RECTAL | Status: DC | PRN

## 2013-01-01 MED ORDER — ACETAMINOPHEN 325 MG PO TABS: 650.0000 mg | ORAL_TABLET | Freq: Four times a day (QID) | ORAL | Status: DC | PRN

## 2013-01-01 MED ORDER — VITAMIN E 45 MG (100 UNIT) PO CAPS
100.0000 [IU] | ORAL_CAPSULE | Freq: Every day | ORAL | Status: DC
  Administered 2013-01-02: 100 [IU] via ORAL
  Filled 2013-01-01 (×2): qty 1

## 2013-01-01 MED ORDER — MIDAZOLAM HCL 5 MG/5ML IJ SOLN
INTRAMUSCULAR | Status: DC | PRN
  Administered 2013-01-01: 2 mg via INTRAVENOUS

## 2013-01-01 MED ORDER — SENNA-DOCUSATE SODIUM 8.6-50 MG PO TABS: 2.0000 | ORAL_TABLET | Freq: Every day | ORAL | Status: DC

## 2013-01-01 MED ORDER — NEOSTIGMINE METHYLSULFATE 1 MG/ML IJ SOLN
INTRAMUSCULAR | Status: DC | PRN
  Administered 2013-01-01: 4 mg via INTRAVENOUS

## 2013-01-01 MED ORDER — DIPHENHYDRAMINE HCL 12.5 MG/5ML PO ELIX
12.5000 mg | ORAL_SOLUTION | ORAL | Status: DC | PRN
  Administered 2013-01-02: 25 mg via ORAL
  Filled 2013-01-01: qty 10

## 2013-01-01 MED ORDER — OXYCODONE HCL 5 MG PO TABS: 5.0000 mg | ORAL_TABLET | Freq: Once | ORAL | Status: DC | PRN

## 2013-01-01 MED ORDER — ONDANSETRON HCL 4 MG/2ML IJ SOLN
4.0000 mg | Freq: Four times a day (QID) | INTRAMUSCULAR | Status: DC | PRN
  Administered 2013-01-02: 4 mg via INTRAVENOUS
  Filled 2013-01-01: qty 2

## 2013-01-01 MED ORDER — PHENYLEPHRINE HCL 10 MG/ML IJ SOLN
INTRAMUSCULAR | Status: DC | PRN
  Administered 2013-01-01: 80 ug via INTRAVENOUS
  Administered 2013-01-01 (×2): 40 ug via INTRAVENOUS

## 2013-01-01 MED ORDER — ROCURONIUM BROMIDE 100 MG/10ML IV SOLN
INTRAVENOUS | Status: DC | PRN
  Administered 2013-01-01: 50 mg via INTRAVENOUS

## 2013-01-01 MED ORDER — OXYCODONE HCL 5 MG PO TABS
ORAL_TABLET | ORAL | Status: AC
  Administered 2013-01-01: 5 mg
  Filled 2013-01-01: qty 1

## 2013-01-01 MED ORDER — HYDROMORPHONE HCL PF 1 MG/ML IJ SOLN
1.0000 mg | INTRAMUSCULAR | Status: DC | PRN
  Administered 2013-01-01 – 2013-01-02 (×5): 1 mg via INTRAVENOUS
  Filled 2013-01-01 (×5): qty 1

## 2013-01-01 SURGICAL SUPPLY — 66 items
BANDAGE ESMARK 6X9 LF (GAUZE/BANDAGES/DRESSINGS) ×1 IMPLANT
BEARING TIB MENISCAL RT SM 4MM (Joint) ×1 IMPLANT
BLADE SAW RECIP 87.9 MT (BLADE) ×2 IMPLANT
BNDG ESMARK 6X9 LF (GAUZE/BANDAGES/DRESSINGS) ×2
BOOTCOVER CLEANROOM LRG (PROTECTIVE WEAR) IMPLANT
BOWL SMART MIX CTS (DISPOSABLE) ×2 IMPLANT
CEMENT HV SMART SET (Cement) ×2 IMPLANT
CLOTH BEACON ORANGE TIMEOUT ST (SAFETY) IMPLANT
CLSR STERI-STRIP ANTIMIC 1/2X4 (GAUZE/BANDAGES/DRESSINGS) ×4 IMPLANT
COMPONENT TIBIAL OXFRD MEDL RT (Orthopedic Implant) ×1 IMPLANT
COVER SURGICAL LIGHT HANDLE (MISCELLANEOUS) ×2 IMPLANT
CUFF TOURNIQUET SINGLE 34IN LL (TOURNIQUET CUFF) ×2 IMPLANT
DRAPE EXTREMITY T 121X128X90 (DRAPE) ×2 IMPLANT
DRAPE ORTHO SPLIT 77X108 STRL (DRAPES) ×1
DRAPE PROXIMA HALF (DRAPES) ×2 IMPLANT
DRAPE SURG ORHT 6 SPLT 77X108 (DRAPES) ×1 IMPLANT
DRAPE U-SHAPE 47X51 STRL (DRAPES) ×2 IMPLANT
DRSG PAD ABDOMINAL 8X10 ST (GAUZE/BANDAGES/DRESSINGS) ×2 IMPLANT
DURAPREP 26ML APPLICATOR (WOUND CARE) ×2 IMPLANT
ELECT CAUTERY BLADE 6.4 (BLADE) ×2 IMPLANT
ELECT REM PT RETURN 9FT ADLT (ELECTROSURGICAL) ×2
ELECTRODE REM PT RTRN 9FT ADLT (ELECTROSURGICAL) ×1 IMPLANT
EVACUATOR 1/8 PVC DRAIN (DRAIN) IMPLANT
FACESHIELD LNG OPTICON STERILE (SAFETY) IMPLANT
GLOVE BIOGEL M 7.0 STRL (GLOVE) IMPLANT
GLOVE BIOGEL PI IND STRL 7.5 (GLOVE) IMPLANT
GLOVE BIOGEL PI INDICATOR 7.5 (GLOVE)
GLOVE BIOGEL PI ORTHO PRO SZ8 (GLOVE) ×2
GLOVE ORTHO TXT STRL SZ7.5 (GLOVE) ×2 IMPLANT
GLOVE PI ORTHO PRO STRL SZ8 (GLOVE) ×2 IMPLANT
GLOVE SURG ORTHO 8.0 STRL STRW (GLOVE) ×4 IMPLANT
GOWN STRL NON-REIN LRG LVL3 (GOWN DISPOSABLE) IMPLANT
HANDPIECE INTERPULSE COAX TIP (DISPOSABLE) ×1
HOOD PEEL AWAY FACE SHEILD DIS (HOOD) ×10 IMPLANT
IMMOBILIZER KNEE 22 UNIV (SOFTGOODS) ×2 IMPLANT
KIT BASIN OR (CUSTOM PROCEDURE TRAY) ×2 IMPLANT
KIT ROOM TURNOVER OR (KITS) ×2 IMPLANT
KIT SAW BLADE (KITS) ×2 IMPLANT
MANIFOLD NEPTUNE II (INSTRUMENTS) ×2 IMPLANT
NS IRRIG 1000ML POUR BTL (IV SOLUTION) ×2 IMPLANT
PACK TOTAL JOINT (CUSTOM PROCEDURE TRAY) ×2 IMPLANT
PAD ARMBOARD 7.5X6 YLW CONV (MISCELLANEOUS) ×4 IMPLANT
PAD CAST 4YDX4 CTTN HI CHSV (CAST SUPPLIES) ×1 IMPLANT
PADDING CAST COTTON 4X4 STRL (CAST SUPPLIES) ×1
PADDING CAST COTTON 6X4 STRL (CAST SUPPLIES) ×2 IMPLANT
PEG FEMORAL PEGGED STRL SM (Knees) ×2 IMPLANT
RUBBERBAND STERILE (MISCELLANEOUS) ×2 IMPLANT
SET HNDPC FAN SPRY TIP SCT (DISPOSABLE) ×1 IMPLANT
SPONGE GAUZE 4X4 12PLY (GAUZE/BANDAGES/DRESSINGS) ×2 IMPLANT
STAPLER VISISTAT 35W (STAPLE) ×2 IMPLANT
SUCTION FRAZIER TIP 10 FR DISP (SUCTIONS) ×2 IMPLANT
SUT MNCRL AB 4-0 PS2 18 (SUTURE) IMPLANT
SUT VIC AB 0 CT1 27 (SUTURE) ×2
SUT VIC AB 0 CT1 27XBRD ANBCTR (SUTURE) ×2 IMPLANT
SUT VIC AB 1 CT1 27 (SUTURE) ×2
SUT VIC AB 1 CT1 27XBRD ANBCTR (SUTURE) ×2 IMPLANT
SUT VIC AB 2-0 CT1 18 (SUTURE) IMPLANT
SUT VIC AB 2-0 CT1 27 (SUTURE)
SUT VIC AB 2-0 CT1 TAPERPNT 27 (SUTURE) IMPLANT
SYR 30ML LL (SYRINGE) ×2 IMPLANT
TIBIAL OXFORD MEDIAL RT (Orthopedic Implant) ×2 IMPLANT
TOWEL OR 17X24 6PK STRL BLUE (TOWEL DISPOSABLE) ×2 IMPLANT
TOWEL OR 17X26 10 PK STRL BLUE (TOWEL DISPOSABLE) ×2 IMPLANT
TRAY FOLEY CATH 16FRSI W/METER (SET/KITS/TRAYS/PACK) IMPLANT
UNICOMPARTMENTAL KNEE MENISCAL (Joint) ×2 IMPLANT
WATER STERILE IRR 1000ML POUR (IV SOLUTION) IMPLANT

## 2013-01-01 NOTE — Preoperative (Signed)
Beta Blockers   Reason not to administer Beta Blockers:Not Applicable 

## 2013-01-01 NOTE — Anesthesia Procedure Notes (Addendum)
Anesthesia Regional Block:  Femoral nerve block  Pre-Anesthetic Checklist: ,, timeout performed, Correct Patient, Correct Site, Correct Laterality, Correct Procedure, Correct Position, site marked, Risks and benefits discussed,  Surgical consent,  Pre-op evaluation,  At surgeon's request and post-op pain management  Laterality: Right  Prep: chloraprep       Needles:   Needle Type: Other     Needle Length: 9cm  Needle Gauge: 21 and 21 G    Additional Needles:  Procedures: ultrasound guided (picture in chart) Femoral nerve block Narrative:  Start time: 01/01/2013 7:07 AM End time: 01/01/2013 7:14 AM Injection made incrementally with aspirations every 5 mL.  Performed by: Personally  Anesthesiologist: C. Frederick MD  Additional Notes: Ultrasound guidance used to: id relevant anatomy, confirm needle position, local anesthetic spread, avoidance of vascular puncture. Picture saved. No complications. Block performed personally by Janetta Hora. Gelene Mink, MD     Procedure Name: Intubation Date/Time: 01/01/2013 7:40 AM Performed by: Marni Griffon Pre-anesthesia Checklist: Patient identified, Emergency Drugs available, Suction available and Patient being monitored Patient Re-evaluated:Patient Re-evaluated prior to inductionOxygen Delivery Method: Circle system utilized Preoxygenation: Pre-oxygenation with 100% oxygen Intubation Type: IV induction Ventilation: Mask ventilation without difficulty Laryngoscope Size: Mac and 3 Grade View: Grade II Tube type: Oral Tube size: 7.5 mm Number of attempts: 1 Airway Equipment and Method: Stylet Placement Confirmation: ETT inserted through vocal cords under direct vision,  breath sounds checked- equal and bilateral and positive ETCO2 Secured at: 21 (cm at teeth) cm Tube secured with: Tape Dental Injury: Teeth and Oropharynx as per pre-operative assessment

## 2013-01-01 NOTE — Transfer of Care (Signed)
Immediate Anesthesia Transfer of Care Note  Patient: Laura Wright  Procedure(s) Performed: Procedure(s): UNICOMPARTMENTAL KNEE (Right)  Patient Location: PACU  Anesthesia Type:General  Level of Consciousness: awake and patient cooperative  Airway & Oxygen Therapy: Patient Spontanous Breathing and Patient connected to nasal cannula oxygen  Post-op Assessment: Report given to PACU RN and Post -op Vital signs reviewed and stable  Post vital signs: Reviewed and stable  Complications: No apparent anesthesia complications

## 2013-01-01 NOTE — Anesthesia Preprocedure Evaluation (Addendum)
Anesthesia Evaluation  Patient identified by MRN, date of birth, ID band Patient awake    Reviewed: Allergy & Precautions, H&P , NPO status , Patient's Chart, lab work & pertinent test results, reviewed documented beta blocker date and time   Airway Mallampati: II TM Distance: >3 FB Neck ROM: full    Dental  (+) Teeth Intact and Dental Advisory Given   Pulmonary neg pulmonary ROS,  breath sounds clear to auscultation        Cardiovascular hypertension, On Medications and Pt. on medications Rhythm:regular     Neuro/Psych  Headaches,  Neuromuscular disease negative psych ROS   GI/Hepatic negative GI ROS, Neg liver ROS,   Endo/Other  negative endocrine ROS  Renal/GU negative Renal ROS  negative genitourinary   Musculoskeletal   Abdominal   Peds  Hematology negative hematology ROS (+)   Anesthesia Other Findings See surgeon's H&P   Reproductive/Obstetrics negative OB ROS                          Anesthesia Physical Anesthesia Plan  ASA: II  Anesthesia Plan: General   Post-op Pain Management:    Induction: Intravenous  Airway Management Planned: Oral ETT  Additional Equipment:   Intra-op Plan:   Post-operative Plan: Extubation in OR  Informed Consent: I have reviewed the patients History and Physical, chart, labs and discussed the procedure including the risks, benefits and alternatives for the proposed anesthesia with the patient or authorized representative who has indicated his/her understanding and acceptance.   Dental Advisory Given  Plan Discussed with: CRNA and Surgeon  Anesthesia Plan Comments:         Anesthesia Quick Evaluation

## 2013-01-01 NOTE — H&P (Signed)
PREOPERATIVE H&P  Chief Complaint: OA RT KNEE  HPI: Laura Wright is a 62 y.o. female who presents for preoperative history and physical with a diagnosis of OA RT KNEE. Symptoms are rated as moderate to severe, and have been worsening.  This is significantly impairing activities of daily living.  She has elected for surgical management. She has failed injections, NSAIDS, activity modification, and knee arthroscopy.  Past Medical History  Diagnosis Date  . Headache(784.0)     due to trigeminal neuralgia and trauma of surgery  . Knee dislocation 12/2011    right  . Dental crowns present     x 2  . Complex tear of medial meniscus of right knee as current injury 12/30/2011  . Localized osteoarthritis of right knee 12/30/2011  . Hypertension     dx'ed in last six months  . Trigeminal neuralgia   . Anxiety   . Herpes simplex type 1 infection    Past Surgical History  Procedure Laterality Date  . Abdominal hysterectomy  1986    partial  . Trigeminal nerve decompression  04/2004  . Knee arthroscopy with medial menisectomy  12/30/2011    Procedure: KNEE ARTHROSCOPY WITH MEDIAL MENISECTOMY;  Surgeon: Eulas Post, MD;  Location: [facility];  Service: Orthopedics;  Laterality: Right;  RIGHT KNEE SCOPE Partial MEDIAL MENISCECTOMY  . Colonoscopy    . Tubal ligation     History   Social History  . Marital Status: Married    Spouse Name: N/A    Number of Children: N/A  . Years of Education: N/A   Social History Main Topics  . Smoking status: Never Smoker   . Smokeless tobacco: Never Used  . Alcohol Use: No  . Drug Use: No  . Sexual Activity: None   Other Topics Concern  . None   Social History Narrative  . None   Family History  Problem Relation Age of Onset  . Anesthesia problems Daughter     post-op N/V   Allergies  Allergen Reactions  . Macrodantin [Nitrofurantoin Macrocrystal] Rash   Prior to Admission medications   Medication Sig Start Date  End Date Taking? Authorizing Provider  calcium carbonate (OS-CAL) 600 MG TABS Take 600 mg by mouth daily.    Yes Historical Provider, MD  clonazePAM (KLONOPIN) 0.5 MG tablet Take 0.5 mg by mouth 3 (three) times daily as needed for anxiety.   Yes Historical Provider, MD  Estradiol (ELESTRIN) 0.52 MG/0.87 GM (0.06%) GEL Apply 1 application topically daily. Applied to arm   Yes Historical Provider, MD  ketorolac (TORADOL) 10 MG tablet Take 1 tablet (10 mg total) by mouth every 6 (six) hours as needed for pain. 12/30/11  Yes Eulas Post, MD  lisinopril-hydrochlorothiazide (PRINZIDE,ZESTORETIC) 10-12.5 MG per tablet Take 1 tablet by mouth daily.   Yes Historical Provider, MD  Multiple Vitamin (MULTIVITAMIN) tablet Take 1 tablet by mouth daily.   Yes Historical Provider, MD  thiamine (VITAMIN B-1) 100 MG tablet Take 100 mg by mouth daily.   Yes Historical Provider, MD  valACYclovir (VALTREX) 1000 MG tablet Take 500 mg by mouth daily.   Yes Historical Provider, MD  vitamin C (ASCORBIC ACID) 500 MG tablet Take 500 mg by mouth daily.   Yes Historical Provider, MD  vitamin E 100 UNIT capsule Take 100 Units by mouth daily.   Yes Historical Provider, MD     Positive ROS: All other systems have been reviewed and were otherwise negative with the exception  of those mentioned in the HPI and as above.  Physical Exam: General: Alert, no acute distress Cardiovascular: No pedal edema Respiratory: No cyanosis, no use of accessory musculature GI: No organomegaly, abdomen is soft and non-tender Skin: No lesions in the area of chief complaint Neurologic: Sensation intact distally Psychiatric: Patient is competent for consent with normal mood and affect Lymphatic: No axillary or cervical lymphadenopathy  MUSCULOSKELETAL: R knee medial pain, 0-130degrees motion, varus, pseudolaxity.  Assessment: OA RT KNEE  Plan: Plan for Procedure(s): UNICOMPARTMENTAL KNEE  The risks benefits and alternatives were  discussed with the patient including but not limited to the risks of nonoperative treatment, versus surgical intervention including infection, bleeding, nerve injury,  blood clots, cardiopulmonary complications, morbidity, mortality, among others, and they were willing to proceed.   Eulas Post, MD Cell [phone]   01/01/2013 7:21 AM

## 2013-01-01 NOTE — Op Note (Signed)
01/01/2013  9:20 AM  PATIENT:  Laura Wright    PRE-OPERATIVE DIAGNOSIS:  Right knee anteromedial osteoarthritis  POST-OPERATIVE DIAGNOSIS:  Same  PROCEDURE:  UNICOMPARTMENTAL right knee replacement  SURGEON:  Eulas Post, MD  PHYSICIAN ASSISTANT: Janace Litten, OPA-C, present and scrubbed throughout the case, critical for completion in a timely fashion, and for retraction, instrumentation, and closure.  ANESTHESIA:   General  PREOPERATIVE INDICATIONS:  Laura Wright is a  62 y.o. female with a diagnosis of right knee medial compartment osteoarthritis who failed conservative measures and elected for surgical management.  She had a previous arthroscopy that demonstrated grade 4 changes on the medial side. She failed injections, activity modification, anti-inflammatories, among others.  The risks benefits and alternatives were discussed with the patient preoperatively including but not limited to the risks of infection, bleeding, nerve injury, cardiopulmonary complications, blood clots, the need for revision surgery, among others, and the patient was willing to proceed.  OPERATIVE IMPLANTS: Biomet Oxford mobile bearing medial compartment arthroplasty size B Tibia,small femur, with a 4 mm polyethylene mobile-bearing insert.  OPERATIVE FINDINGS: Endstage grade 4 medial compartment osteoarthritis. No significant changes in the lateral or patellofemoral joint.  There was some degenerative changes on the medial facet of the patella, with some osteophyte formation medially and within the notch. Her  OPERATIVE PROCEDURE: The patient was brought to the operating room placed in supine position. General anesthesia was administered. IV antibiotics were given. The lower extremity was placed in the legholder and prepped and draped in usual sterile fashion.  Time out was performed.  The leg was elevated and exsanguinated and the tourniquet was inflated. Anteromedial incision was performed, and  I took care to preserve the MCL. Parapatellar incision was carried out, and the osteophytes were excised, along with the medial meniscus and a small portion of the fat pad.  The extra medullary tibial cutting jig was applied, using the spoon and the 4mm G-Clamp, and I took care to protect the anterior cruciate ligament insertion and the tibial spine. The medial collateral ligament was also protected, and I resected my proximal tibia, matching the anatomic slope.   The proximal tibial bony cut was removed in one piece, and I turned my attention to the femur.  The intramedullary femoral rod was placed using the drill, and then using the appropriate reference, I assembled the femoral jig, setting my posterior cutting block. I resected my posterior femur, and then measured my gap.   I then used the mill to match the extension gap to the flexion gap. The gaps were then measured again with the appropriate feeler gauges. Once I had balanced flexion and extension gaps, I then completed the preparation of the femur.  The size 5 was slightly tight, and a size 4 went smoothly in, and so I elected to use the 4.  I milled off the anterior aspect of the distal femur to prevent impingement. I also exposed the tibia, and selected the above-named component, and then used the cutting jig to prepare the keel slot on the tibia. I also used the awl to curette out the bone to complete the preparation of the keel. The back wall was intact.  I then placed trial components, and it was found to have excellent motion, and appropriate balance.  I then cemented the components into place, cementing the tibia first, removing all excess cement, and then cementing the femur.  All loose cement was removed.  The real polyethylene insert was applied manually, and  the knee was taken through functional range of motion, and found to have excellent stability and restoration of joint motion, with excellent balance.  The wounds were  irrigated copiously, and the parapatellar tissue closed with Vicryl, followed by Vicryl for the subcutaneous tissue, with routine closure with Steri-Strips and sterile gauze.  The tourniquet was released, and the patient was awakened and extubated and returned to PACU in stable and satisfactory condition. There were no complications.

## 2013-01-01 NOTE — Anesthesia Postprocedure Evaluation (Signed)
Anesthesia Post Note  Patient: Laura Wright  Procedure(s) Performed: Procedure(s) (LRB): UNICOMPARTMENTAL KNEE (Right)  Anesthesia type: General  Patient location: PACU  Post pain: Pain level controlled  Post assessment: Patient's Cardiovascular Status Stable  Last Vitals:  Filed Vitals:   01/01/13 1553  BP: 126/78  Pulse: 107  Temp: 36.9 C  Resp: 18    Post vital signs: Reviewed and stable  Level of consciousness: alert  Complications: No apparent anesthesia complications

## 2013-01-02 LAB — BASIC METABOLIC PANEL
BUN: 11 mg/dL (ref 6–23)
CO2: 25 mEq/L (ref 19–32)
Calcium: 8.3 mg/dL — ABNORMAL LOW (ref 8.4–10.5)
Chloride: 98 mEq/L (ref 96–112)
Creatinine, Ser: 0.82 mg/dL (ref 0.50–1.10)
GFR calc Af Amer: 88 mL/min — ABNORMAL LOW (ref >90)
GFR calc non Af Amer: 76 mL/min — ABNORMAL LOW (ref >90)
Sodium: 134 mEq/L — ABNORMAL LOW (ref 137–147)

## 2013-01-02 LAB — CBC
MCH: 28.2 pg (ref 26.0–34.0)
Platelets: 247 10*3/uL (ref 150–400)
RBC: 4.12 MIL/uL (ref 3.87–5.11)
RDW: 13.6 % (ref 11.5–15.5)
WBC: 15 10*3/uL — ABNORMAL HIGH (ref 4.0–10.5)

## 2013-01-02 MED ORDER — PROMETHAZINE HCL 25 MG/ML IJ SOLN: 25.0000 mg | Freq: Four times a day (QID) | INTRAMUSCULAR | Status: DC | PRN

## 2013-01-02 MED ORDER — PROMETHAZINE HCL 25 MG RE SUPP: 25.0000 mg | Freq: Four times a day (QID) | RECTAL | Status: DC | PRN

## 2013-01-02 MED ORDER — PROMETHAZINE HCL 25 MG PO TABS
25.0000 mg | ORAL_TABLET | Freq: Four times a day (QID) | ORAL | Status: DC | PRN
  Administered 2013-01-02: 25 mg via ORAL
  Filled 2013-01-02: qty 1

## 2013-01-02 NOTE — Progress Notes (Signed)
Patient discharge home after this afternoon after PT. Prescriptions given. Discharge instructions given.

## 2013-01-02 NOTE — Evaluation (Signed)
Occupational Therapy Evaluation Patient Details Name: Laura Wright MRN: [id number] DOB: September 04, 1950 Today's Date: 01/02/2013 Time: 4098-1191 OT Time Calculation (min): 23 min  OT Assessment / Plan / Recommendation History of present illness Pt is a 62 y/o female admitted s/p unicompartmental knee replacement on the right.    Clinical Impression   Patient is s/p avove surgery resulting in functional limitations.  Patient currently min-mod assist with BADL tasks however patient and daughter report there is plenty of help at home and declined further OT.  Signing off.     OT Assessment  Patient does not need any further OT services    Follow Up Recommendations  No OT follow up    Equipment Recommendations  3 in 1 bedside comode    Precautions / Restrictions Precautions Precautions: Fall;Knee Precaution Comments: Discussed towel roll under heel and no pillow under knee Required Braces or Orthoses: Knee Immobilizer - Right Knee Immobilizer - Right: Discontinue post op day 2 Restrictions Weight Bearing Restrictions: Yes RLE Weight Bearing: Weight bearing as tolerated   Pertinent Vitals/Pain 6/10 left knee, repositioned, rest and RN provided medication    ADL  Grooming: Performed;Set up Where Assessed - Grooming: Supported standing Upper Body Bathing: Simulated;Set up Where Assessed - Upper Body Bathing: Unsupported sitting Lower Body Bathing: Simulated;Moderate assistance Where Assessed - Lower Body Bathing: Supported sitting;Supported standing;Supported sit to stand Upper Body Dressing: Simulated;Set up Where Assessed - Upper Body Dressing: Unsupported sitting Lower Body Dressing: Performed;Simulated;Moderate assistance Where Assessed - Lower Body Dressing: Supported sitting;Supported standing;Supported sit to stand Toilet Transfer: Performed;Minimal Dentist Method: Sit to stand;Stand pivot Acupuncturist: Comfort height toilet;Grab  bars Toileting - Clothing Manipulation and Hygiene: Performed;Min guard Where Assessed - Engineer, mining and Hygiene: Sit on 3-in-1 or toilet Tub/Shower Transfer Method: Not assessed Transfers/Ambulation Related to ADLs: Pt limited by pain and medication causing pt to be drowsy and nauseated.  Pt vcs for hand placement and technique during functional mobility ADL Comments: Pt's daughter present during session and both report that patient will have all the help she needs at home and declined further acute OT or HHOT.       Visit Information  Last OT Received On: 01/02/13 Assistance Needed: +1 History of Present Illness: Pt is a 62 y/o female admitted s/p unicompartmental knee replacement on the right.        Prior Functioning     Home Living Family/patient expects to be discharged to:: Private residence Living Arrangements: Spouse/significant other Available Help at Discharge: Family;Available 24 hours/day Type of Home: House Home Access: Stairs to enter [employer] of Steps: 3 Entrance Stairs-Rails: Left Home Layout: One level Home Equipment: Shower seat - built in;Grab bars - tub/shower Additional Comments: Patient has walk in shower with door, built in seat and graba bar.  Patient also has a shower seat. Prior Function Level of Independence: Independent Communication Communication: No difficulties Dominant Hand: Right    Cognition  Cognition Arousal/Alertness: Suspect due to medications;Lethargic Behavior During Therapy: WFL for tasks assessed/performed Overall Cognitive Status: Within Functional Limits for tasks assessed    Extremity/Trunk Assessment Upper Extremity Assessment Upper Extremity Assessment: Overall WFL for tasks assessed Lower Extremity Assessment Lower Extremity Assessment: Defer to PT evaluation RLE Deficits / Details: Decreased strength and AROM consistent with uni knee replacement.  RLE: Unable to fully assess due to  pain Cervical / Trunk Assessment Cervical / Trunk Assessment: Normal     Mobility Bed Mobility Bed Mobility: Not assessed Supine to  Sit: 4: Min assist;HOB elevated;With rails Sitting - Scoot to Edge of Bed: 4: Min assist;With rail Details for Bed Mobility Assistance: Assist for movement and support of RLE while transitioning to EOB. VC's for sequencing and technique.  Transfers Sit to Stand: 4: Min assist;With upper extremity assist;From chair/3-in-1 Stand to Sit: 4: Min guard;To chair/3-in-1;With upper extremity assist Details for Transfer Assistance: VC's for hand placement on seated surface for safety, as well as WBAT status.      Exercise Total Joint Exercises Ankle Circles/Pumps: 10 reps Quad Sets: 10 reps Heel Slides: 10 reps Hip ABduction/ADduction: 10 reps   Balance Balance Balance Assessed: No   End of Session OT - End of Session Equipment Utilized During Treatment: Rolling walker Activity Tolerance: Patient limited by pain Patient left: in chair;with call bell/phone within reach;with family/visitor present Nurse Communication: Other (comment) (O2 sats greater than 92% on room air during and after OT)    Malene Blaydes 01/02/2013, 12:55 PM

## 2013-01-02 NOTE — Care Management Note (Signed)
CARE MANAGEMENT NOTE 01/02/2013  Patient:  Laura Wright, Laura Wright   Account Number:  [id number]  Date Initiated:  01/02/2013  Documentation initiated by:  Vance Peper  Subjective/Objective Assessment:   62 yr old female s/p right total knee arthroplasty.     Action/Plan:   CM spoke with patient and daughter concerning home health and DME needs at discharge. Choice offered. Referral called to Debbie, [facility] liasion.   Anticipated DC Date:  01/02/2013   Anticipated DC Plan:  HOME W HOME HEALTH SERVICES      DC Planning Services  CM consult      [facility] Choice  HOME HEALTH  DURABLE MEDICAL EQUIPMENT   Choice offered to / List presented to:  C-1 Patient   DME arranged  [facility]  3-N-1      DME agency  [employer]     HH arranged  HH-2 PT      HH agency  [facility]   Status of service:  Completed, signed off  Discharge Disposition:  HOME W HOME HEALTH SERVICES

## 2013-01-02 NOTE — Progress Notes (Signed)
Patient ID: Laura Wright, female   DOB: 04/09/1950, 62 y.o.   MRN: [id number]     Subjective:  Patient reports pain as mild to moderate.  Patient still having pain with motion and at rest. Patient alert and sitting up in bed.  Ice spilled yesterday, but didn't get onto the wound.  Objective:   VITALS:   Filed Vitals:   01/01/13 2131 01/02/13 0000 01/02/13 0400 01/02/13 0541  BP: 116/69   113/73  Pulse: 106   98  Temp: 98.3 F (36.8 C)   97.8 F (36.6 C)  TempSrc: Oral   Oral  Resp: 19 18 17 19   Height:      Weight:      SpO2: 97% 98% 98% 99%    ABD soft Dorsiflexion/Plantar flexion intact Incision: dressing C/D/I and no drainage Sensation intact to the foot EHL FHL firing Pain in the top of the foot with active motion No active motion of the knee Block may still have some affect   Lab Results  Component Value Date   WBC 12.6* 01/01/2013   HGB 13.0 01/01/2013   HCT 39.6 01/01/2013   MCV 87.0 01/01/2013   PLT 267 01/01/2013     Assessment/Plan: 1 Day Post-Op   Active Problems:   Knee osteoarthritis   Advance diet Up with therapy WBAT right lower ext. Plan to keep until tomorrow but may be able to DC home if pain better controlled today and patient able to pass PT.   Torrie Mayers, BRANDON 01/02/2013, 7:54 AM   Teryl Lucy, MD Cell [phone]

## 2013-01-02 NOTE — Discharge Instructions (Signed)
Diet: As you were doing prior to hospitalization   Activity:  Increase activity slowly as tolerated                  No lifting or driving for 6 weeks  Shower:  May shower but keep the wounds dry, use an occlusive plastic wrap, NO SOAKING IN TUB.  If the bandage gets wet, change with a clean dry gauze.  Dressing:  You may change your dressing on Monday                    Then change the dressing daily with sterile 4"x4"s gauze dressing                     And TED hose for knees.  Use paper tape to hold dressing in place                     For hips.  You may clean the incision with alcohol prior to redressing.  There are sticky tapes (steri-strips) on your wounds and all the stitches are absorbable.  Leave the steri-strips in place when changing your dressings, they will peel off with time, usually 2-3 weeks.  Weight Bearing:   weight bearing as taught in physical therapy.  Use a walker or                    Crutches as instructed.  To prevent constipation: you may use a stool softener such as -               Colace ( over the counter) 100 mg by mouth twice a day                Drink plenty of fluids ( prune juice may be helpful) and high fiber foods                Miralax ( over the counter) for constipation as needed.    Itching:  If you experience itching with your medications, try taking only a single pain pill, or even half a pain pill at a time.  You may take up to 10 pain pills per day, and you can also use benadryl over the counter for itching or also to help with sleep.   To prevent constipation: you may use a stool softener such as -               Colace ( over the counter) 100 mg by mouth twice a day                Drink plenty of fluids ( prune juice may be helpful) and high fiber foods                Miralax ( over the counter) for constipation as needed.    Precautions:  If you experience chest pain or shortness of breath - call 911 immediately for transfer to the hospital  emergency department!!                If you develop a fever greater that 101 F, purulent drainage from wound,                             increased redness or drainage from wound, or calf pain -- Call the office   Follow- Up Appointment:  Please call for an appointment to be  seen in 2 weeks               [place] - [phone]    Home Health PT to be provided by [facility] [phone]

## 2013-01-02 NOTE — Progress Notes (Signed)
Physical Therapy Treatment Patient Details Name: Laura Wright MRN: [id number] DOB: 1950/08/16 Today's Date: 01/02/2013 Time: 4098-1191 PT Time Calculation (min): 34 min  PT Assessment / Plan / Recommendation  History of Present Illness Pt is a 62 y/o female admitted s/p unicompartmental knee replacement on the right.    PT Comments   Pt progressing well towards physical therapy goals. Pt and husband educated on safe stair negotiation, and pt was able to ascend and descend 2 large steps with RW. HEP given and discussed with pt and family, and car transfer was verbally sequenced. Pt and family report no further questions, and anticipate d/c home this evening.   Follow Up Recommendations  Home health PT     Does the patient have the potential to tolerate intense rehabilitation     Barriers to Discharge        Equipment Recommendations  Rolling walker with 5" wheels;3in1 (PT)    Recommendations for Other Services    Frequency 7X/week   Progress towards PT Goals Progress towards PT goals: Progressing toward goals  Plan Current plan remains appropriate    Precautions / Restrictions Precautions Precautions: Fall;Knee Precaution Comments: Discussed towel roll under heel and no pillow under knee Required Braces or Orthoses: Knee Immobilizer - Right Knee Immobilizer - Right: Discontinue post op day 2 Restrictions Weight Bearing Restrictions: Yes RLE Weight Bearing: Weight bearing as tolerated   Pertinent Vitals/Pain 6/10 at rest    Mobility  Bed Mobility Bed Mobility: Not assessed Details for Bed Mobility Assistance: Pt received up in chair. Transfers Transfers: Sit to Stand;Stand to Sit Sit to Stand: 4: Min guard;From chair/3-in-1;With upper extremity assist Stand to Sit: 4: Min guard;To chair/3-in-1;With upper extremity assist Details for Transfer Assistance: Pt demonstrated proper hand placement and safety awareness.  Ambulation/Gait Ambulation/Gait Assistance: 4: Min  guard Ambulation Distance (Feet): 50 Feet Assistive device: Rolling walker Ambulation/Gait Assistance Details: VC's for sequencing and safety awareness with the RW. Pt was cued for increased heel strike, and encouraged step-through gait pattern.  Gait Pattern: Step-to pattern;Step-through pattern;Decreased stride length;Trunk flexed Gait velocity: Decreased General Gait Details: Generally avoids putting weight through RLE.  Stairs: Yes Stairs Assistance: 4: Min assist Stairs Assistance Details (indicate cue type and reason): Assist for walker placement, as pt was negotiating steps backwards.  Stair Management Technique: Backwards;With walker Number of Stairs: 2 Wheelchair Mobility Wheelchair Mobility: No    Exercises     PT Diagnosis:    PT Problem List:   PT Treatment Interventions:     PT Goals (current goals can now be found in the care plan section) Acute Rehab PT Goals Patient Stated Goal: To return home PT Goal Formulation: With patient/family Time For Goal Achievement: 01/09/13 Potential to Achieve Goals: Good  Visit Information  Last PT Received On: 01/02/13 Assistance Needed: +1 History of Present Illness: Pt is a 62 y/o female admitted s/p unicompartmental knee replacement on the right.     Subjective Data  Subjective: "I am about to be discharged." Patient Stated Goal: To return home   Cognition  Cognition Arousal/Alertness: Awake/alert Behavior During Therapy: WFL for tasks assessed/performed Overall Cognitive Status: Within Functional Limits for tasks assessed    Balance     End of Session PT - End of Session Equipment Utilized During Treatment: Gait belt;Right knee immobilizer Activity Tolerance: Patient tolerated treatment well Patient left: in chair;with call bell/phone within reach;with family/visitor present Nurse Communication: Mobility status   GP     Ruthann Cancer 01/02/2013, 4:35  PM  Ruthann Cancer, PT, DPT [phone]

## 2013-01-02 NOTE — Evaluation (Signed)
Physical Therapy Evaluation Patient Details Name: Laura Wright MRN: [id number] DOB: [birth year] Today's Date: 01/02/2013 Time: 4132-4401 PT Time Calculation (min): 31 min  PT Assessment / Plan / Recommendation History of Present Illness  Pt is a 62 y/o female admitted s/p unicompartmental knee replacement on the right.   Clinical Impression  This patient presents with acute pain and decreased functional independence following the above mentioned procedure. At the time of PT eval, pt was able to perform transfers and ambulation with min assist. Pt feels she is limited by pain, and received pain medicine during session. This patient is appropriate for skilled PT interventions to address functional limitations, improve safety and independence with functional mobility, and return to PLOF.    PT Assessment  Patient needs continued PT services    Follow Up Recommendations  Home health PT    Does the patient have the potential to tolerate intense rehabilitation      Barriers to Discharge        Equipment Recommendations  Rolling walker with 5" wheels;3in1 (PT)    Recommendations for Other Services     Frequency 7X/week    Precautions / Restrictions Precautions Precautions: Fall;Knee Precaution Comments: Discussed towel roll under heel and no pillow under knee Required Braces or Orthoses: Knee Immobilizer - Right Knee Immobilizer - Right: Discontinue post op day 2 Restrictions Weight Bearing Restrictions: Yes RLE Weight Bearing: Weight bearing as tolerated   Pertinent Vitals/Pain 6/10 at rest, 8/10 after session      Mobility  Bed Mobility Bed Mobility: Supine to Sit;Sitting - Scoot to Edge of Bed Supine to Sit: 4: Min assist;HOB elevated;With rails Sitting - Scoot to Edge of Bed: 4: Min assist;With rail Details for Bed Mobility Assistance: Assist for movement and support of RLE while transitioning to EOB. VC's for sequencing and technique.  Transfers Transfers: Sit to  Stand;Stand to Sit Sit to Stand: 4: Min assist;From bed;With upper extremity assist Stand to Sit: 4: Min guard;To chair/3-in-1;With upper extremity assist Details for Transfer Assistance: VC's for hand placement on seated surface for safety, as well as WBAT status.  Ambulation/Gait Ambulation/Gait Assistance: 4: Min assist Ambulation Distance (Feet): 15 Feet Assistive device: Rolling walker Ambulation/Gait Assistance Details: Assist for movement and positioning of RW during turns. VC's for sequencing and safety awareness with RW and WBAT status on right.  Gait Pattern: Step-to pattern;Decreased stride length;Trunk flexed;Right flexed knee in stance Gait velocity: Decreased General Gait Details: Generally avoids putting weight through RLE.  Stairs: No Wheelchair Mobility Wheelchair Mobility: No    Exercises Total Joint Exercises Ankle Circles/Pumps: 10 reps Quad Sets: 10 reps Heel Slides: 10 reps Hip ABduction/ADduction: 10 reps   PT Diagnosis: Difficulty walking;Acute pain  PT Problem List: Decreased strength;Decreased range of motion;Decreased activity tolerance;Decreased balance;Decreased mobility;Decreased knowledge of use of DME;Decreased safety awareness;Decreased knowledge of precautions;Pain PT Treatment Interventions: DME instruction;Gait training;Stair training;Functional mobility training;Therapeutic activities;Therapeutic exercise;Neuromuscular re-education;Patient/family education     PT Goals(Current goals can be found in the care plan section) Acute Rehab PT Goals Patient Stated Goal: To return home PT Goal Formulation: With patient/family Time For Goal Achievement: 01/09/13 Potential to Achieve Goals: Good  Visit Information  Last PT Received On: 01/02/13 Assistance Needed: +1 History of Present Illness: Pt is a 62 y/o female admitted s/p unicompartmental knee replacement on the right.        Prior Functioning  Home Living Family/patient expects to be  discharged to:: Private residence Living Arrangements: Spouse/significant other Available Help at Discharge: Family;Available  24 hours/day Type of Home: House Home Access: Stairs to enter [employer] of Steps: 3 Entrance Stairs-Rails: Left Home Layout: One level Home Equipment: Shower seat - built in Prior Function Level of Independence: Independent Communication Communication: No difficulties Dominant Hand: Right    Cognition  Cognition Arousal/Alertness: Awake/alert Behavior During Therapy: WFL for tasks assessed/performed Overall Cognitive Status: Within Functional Limits for tasks assessed    Extremity/Trunk Assessment Upper Extremity Assessment Upper Extremity Assessment: Defer to OT evaluation Lower Extremity Assessment Lower Extremity Assessment: RLE deficits/detail RLE Deficits / Details: Decreased strength and AROM consistent with uni knee replacement.  RLE: Unable to fully assess due to pain Cervical / Trunk Assessment Cervical / Trunk Assessment: Normal   Balance Balance Balance Assessed: No  End of Session PT - End of Session Equipment Utilized During Treatment: Gait belt;Right knee immobilizer Activity Tolerance: Patient tolerated treatment well Patient left: in chair;with call bell/phone within reach;with family/visitor present Nurse Communication: Mobility status  GP     Ruthann Cancer 01/02/2013, 10:38 AM  Ruthann Cancer, PT, DPT [phone]

## 2013-01-04 NOTE — Discharge Summary (Signed)
Physician Discharge Summary  Patient ID: Laura Wright MRN: [id number] DOB/AGE: 1950/02/24 63 y.o.  Admit date: 01/01/2013 Discharge date: 01/02/2013  Admission Diagnoses: right knee anteromedial osteoarthritis   Discharge Diagnoses:  Active Problems:   Knee osteoarthritis   Past Medical History  Diagnosis Date  . Headache(784.0)     due to trigeminal neuralgia and trauma of surgery  . Knee dislocation 12/2011    right  . Dental crowns present     x 2  . Complex tear of medial meniscus of right knee as current injury 12/30/2011  . Localized osteoarthritis of right knee 12/30/2011  . Hypertension     dx'ed in last six months  . Trigeminal neuralgia   . Anxiety   . Herpes simplex type 1 infection     Surgeries: Procedure(s): UNICOMPARTMENTAL KNEE on 01/01/2013   Consultants (if any):    Discharged Condition: Improved  Hospital Course: Laura Wright is an 63 y.o. female who was admitted 01/01/2013 with a diagnosis of <principal problem not specified> and went to the operating room on 01/01/2013 and underwent the above named procedures.    She was given perioperative antibiotics:  Anti-infectives   Start     Dose/Rate Route Frequency Ordered Stop   01/01/13 1700  valACYclovir (VALTREX) tablet 500 mg  Status:  Discontinued     500 mg Oral Daily 01/01/13 1521 01/02/13 2012   01/01/13 1530  ceFAZolin (ANCEF) IVPB 2 g/50 mL premix     2 g 100 mL/hr over 30 Minutes Intravenous Every 6 hours 01/01/13 1521 01/02/13 0329   01/01/13 0600  ceFAZolin (ANCEF) IVPB 2 g/50 mL premix     2 g 100 mL/hr over 30 Minutes Intravenous On call to O.R. 12/31/12 1412 01/01/13 0745    .  She was given sequential compression devices, early ambulation, and lovenox for DVT prophylaxis.  She benefited maximally from the hospital stay and there were no complications.    Recent vital signs:  Filed Vitals:   01/02/13 1118  BP:   Pulse:   Temp:   Resp: 19    Recent laboratory  studies:  Lab Results  Component Value Date   HGB 11.6* 01/02/2013   HGB 13.0 01/01/2013   HGB 14.0 12/25/2012   Lab Results  Component Value Date   WBC 15.0* 01/02/2013   PLT 247 01/02/2013   No results found for this basename: INR   Lab Results  Component Value Date   NA 134* 01/02/2013   K 4.8 01/02/2013   CL 98 01/02/2013   CO2 25 01/02/2013   BUN 11 01/02/2013   CREATININE 0.82 01/02/2013   GLUCOSE 133* 01/02/2013    Discharge Medications:     Medication List    STOP taking these medications       ketorolac 10 MG tablet  Commonly known as:  TORADOL      TAKE these medications       calcium carbonate 600 MG Tabs tablet  Commonly known as:  OS-CAL  Take 600 mg by mouth daily.     clonazePAM 0.5 MG tablet  Commonly known as:  KLONOPIN  Take 0.5 mg by mouth 3 (three) times daily as needed for anxiety.     ELESTRIN 0.52 MG/0.87 GM (0.06%) Gel  Generic drug:  Estradiol  Apply 1 application topically daily. Applied to arm     enoxaparin 30 MG/0.3ML injection  Commonly known as:  LOVENOX  Inject 0.3 mLs (30 mg total) into the skin  every 12 (twelve) hours.     lisinopril-hydrochlorothiazide 10-12.5 MG per tablet  Commonly known as:  PRINZIDE,ZESTORETIC  Take 1 tablet by mouth daily.     methocarbamol 500 MG tablet  Commonly known as:  ROBAXIN  Take 1 tablet (500 mg total) by mouth 4 (four) times daily.     multivitamin tablet  Take 1 tablet by mouth daily.     oxyCODONE-acetaminophen 10-325 MG per tablet  Commonly known as:  PERCOCET  Take 1-2 tablets by mouth every 6 (six) hours as needed for pain. MAXIMUM TOTAL ACETAMINOPHEN DOSE IS 4000 MG PER DAY     promethazine 25 MG tablet  Commonly known as:  PHENERGAN  Take 1 tablet (25 mg total) by mouth every 6 (six) hours as needed for nausea or vomiting.     sennosides-docusate sodium 8.6-50 MG tablet  Commonly known as:  SENOKOT-S  Take 2 tablets by mouth daily.     thiamine 100 MG tablet   Commonly known as:  VITAMIN B-1  Take 100 mg by mouth daily.     VALTREX 1000 MG tablet  Generic drug:  valACYclovir  Take 500 mg by mouth daily.     vitamin C 500 MG tablet  Commonly known as:  ASCORBIC ACID  Take 500 mg by mouth daily.     vitamin E 100 UNIT capsule  Take 100 Units by mouth daily.        Diagnostic Studies: Dg Chest 2 View  12/25/2012   CLINICAL DATA:  Preop evaluation  EXAM: CHEST  2 VIEW  COMPARISON:  None.  FINDINGS: The heart size and mediastinal contours are within normal limits. There is mild tortuosity of the aorta. Both lungs are clear. The visualized skeletal structures are unremarkable.  IMPRESSION: No active cardiopulmonary disease.   Electronically Signed   By: Margaree Mackintosh M.D.   On: 12/25/2012 12:03   Dg Knee Right Port  01/01/2013   CLINICAL DATA:  Post arthroplasty  EXAM: PORTABLE RIGHT KNEE - 1-2 VIEW  COMPARISON:  None.  FINDINGS: Post replacement of the medial compartment of the knee. Alignment appears anatomic. No evidence of hardware failure or loosening. No fracture or dislocation. There is a minimal amount of expected intra-articular air and lipohemarthrosis. No radiopaque foreign body.  IMPRESSION: Post medial compartment knee replacement without evidence of complication.   Electronically Signed   By: Sandi Mariscal M.D.   On: 01/01/2013 16:28    Disposition: 06-Home-Health Care Svc      Discharge Orders   Future Orders Complete By Expires   Call MD / Call 911  As directed    Comments:     If you experience chest pain or shortness of breath, CALL 911 and be transported to the hospital emergency room.  If you develope a fever above 101 F, pus (white drainage) or increased drainage or redness at the wound, or calf pain, call your surgeon's office.   Constipation Prevention  As directed    Comments:     Drink plenty of fluids.  Prune juice may be helpful.  You may use a stool softener, such as Colace (over the counter) 100 mg twice a day.   Use MiraLax (over the counter) for constipation as needed.   Diet general  As directed    Discharge instructions  As directed    Comments:     Change dressing in 3 days and reapply fresh dressing, unless you have a splint (half cast).  If you have  a splint/cast, just leave in place until your follow-up appointment.    Keep wounds dry for 3 weeks.  Leave steri-strips in place on skin.  Do not apply lotion or anything to the wound.   Weight bearing as tolerated  As directed    Questions:     Laterality:     Extremity:        Follow-up Information   Follow up with Johnny Bridge, MD. Schedule an appointment as soon as possible for a visit in 2 weeks.   Specialty:  Orthopedic Surgery   Contact information:   [place] [address] [phone]        Signed: Johnny Bridge 01/04/2013, 9:28 AM

## 2013-01-07 ENCOUNTER — Encounter (HOSPITAL_COMMUNITY): Payer: Self-pay | Admitting: Orthopedic Surgery

## 2013-10-12 DIAGNOSIS — L600 Ingrowing nail: Secondary | ICD-10-CM | POA: Insufficient documentation

## 2013-10-12 DIAGNOSIS — H5005 Alternating esotropia: Secondary | ICD-10-CM | POA: Insufficient documentation

## 2013-10-12 DIAGNOSIS — I10 Essential (primary) hypertension: Secondary | ICD-10-CM | POA: Insufficient documentation

## 2013-10-12 DIAGNOSIS — R319 Hematuria, unspecified: Secondary | ICD-10-CM | POA: Insufficient documentation

## 2013-10-12 DIAGNOSIS — G43009 Migraine without aura, not intractable, without status migrainosus: Secondary | ICD-10-CM | POA: Insufficient documentation

## 2013-10-12 DIAGNOSIS — M25539 Pain in unspecified wrist: Secondary | ICD-10-CM | POA: Insufficient documentation

## 2013-10-12 DIAGNOSIS — K644 Residual hemorrhoidal skin tags: Secondary | ICD-10-CM | POA: Insufficient documentation

## 2013-10-12 DIAGNOSIS — L720 Epidermal cyst: Secondary | ICD-10-CM | POA: Insufficient documentation

## 2013-10-12 DIAGNOSIS — F520 Hypoactive sexual desire disorder: Secondary | ICD-10-CM | POA: Insufficient documentation

## 2013-10-12 DIAGNOSIS — M545 Low back pain, unspecified: Secondary | ICD-10-CM | POA: Insufficient documentation

## 2013-10-12 DIAGNOSIS — G500 Trigeminal neuralgia: Secondary | ICD-10-CM | POA: Insufficient documentation

## 2013-10-12 DIAGNOSIS — B351 Tinea unguium: Secondary | ICD-10-CM | POA: Insufficient documentation

## 2013-10-12 DIAGNOSIS — J3489 Other specified disorders of nose and nasal sinuses: Secondary | ICD-10-CM | POA: Insufficient documentation

## 2013-10-12 DIAGNOSIS — H9319 Tinnitus, unspecified ear: Secondary | ICD-10-CM | POA: Insufficient documentation

## 2013-10-12 DIAGNOSIS — K6289 Other specified diseases of anus and rectum: Secondary | ICD-10-CM | POA: Insufficient documentation

## 2013-10-12 DIAGNOSIS — K625 Hemorrhage of anus and rectum: Secondary | ICD-10-CM | POA: Insufficient documentation

## 2013-10-12 DIAGNOSIS — H5501 Congenital nystagmus: Secondary | ICD-10-CM | POA: Insufficient documentation

## 2013-10-12 DIAGNOSIS — N951 Menopausal and female climacteric states: Secondary | ICD-10-CM | POA: Insufficient documentation

## 2013-10-12 DIAGNOSIS — E663 Overweight: Secondary | ICD-10-CM | POA: Insufficient documentation

## 2014-04-24 DIAGNOSIS — F411 Generalized anxiety disorder: Secondary | ICD-10-CM | POA: Insufficient documentation

## 2014-10-01 DIAGNOSIS — E663 Overweight: Secondary | ICD-10-CM | POA: Insufficient documentation

## 2015-02-04 ENCOUNTER — Other Ambulatory Visit: Payer: Self-pay | Admitting: Orthopedic Surgery

## 2015-02-04 DIAGNOSIS — M545 Low back pain, unspecified: Secondary | ICD-10-CM

## 2015-02-04 DIAGNOSIS — G8929 Other chronic pain: Secondary | ICD-10-CM

## 2015-02-09 ENCOUNTER — Other Ambulatory Visit: Payer: Self-pay | Admitting: Orthopedic Surgery

## 2015-02-09 DIAGNOSIS — G8929 Other chronic pain: Secondary | ICD-10-CM

## 2015-02-09 DIAGNOSIS — M545 Low back pain: Principal | ICD-10-CM

## 2015-02-11 MED ORDER — CEFAZOLIN SODIUM-DEXTROSE 2-3 GM-% IV SOLR
2.0000 g | Freq: Once | INTRAVENOUS | Status: AC
  Administered 2015-02-12: 2 g via INTRAVENOUS

## 2015-02-11 MED ORDER — MIDAZOLAM HCL 2 MG/2ML IJ SOLN
1.0000 mg | INTRAMUSCULAR | Status: DC | PRN
Start: 2015-02-12 — End: 2015-02-13
  Administered 2015-02-12 (×2): 1 mg via INTRAVENOUS
  Administered 2015-02-12: 0.5 mg via INTRAVENOUS

## 2015-02-11 MED ORDER — SODIUM CHLORIDE 0.9 % IV SOLN
Freq: Once | INTRAVENOUS | Status: AC
  Administered 2015-02-12 (×3): via INTRAVENOUS

## 2015-02-11 MED ORDER — FENTANYL CITRATE (PF) 100 MCG/2ML IJ SOLN
25.0000 ug | INTRAMUSCULAR | Status: DC | PRN
  Administered 2015-02-12: 100 ug via INTRAVENOUS

## 2015-02-11 MED ORDER — KETOROLAC TROMETHAMINE 30 MG/ML IJ SOLN
30.0000 mg | Freq: Once | INTRAMUSCULAR | Status: AC
  Administered 2015-02-12: 30 mg via INTRAVENOUS

## 2015-02-11 NOTE — Discharge Instructions (Addendum)
Discogram Post Procedure Discharge Instructions  1. May resume a regular diet and any medications that you routinely take (including pain medications). 2. No driving day of procedure. 3. Upon discharge go home and bedrest for at least 24 hours.  May use an ice pack as needed to injection sites on back.  Ice to back 30 minutes on and 30 minutes off, all day. 4. May remove bandades later, today. 5. It is not unusual to be sore for several days after this procedure.    Please contact our office at [phone] for the following symptoms:   Fever greater than 100 degrees  Increased swelling, pain, or redness at injection site.   Thank you for visiting [facility].

## 2015-02-12 ENCOUNTER — Ambulatory Visit
Admission: RE | Admit: 2015-02-12 | Discharge: 2015-02-12 | Disposition: A | Discharge status: Home / Self Care | Payer: PRIVATE HEALTH INSURANCE | Source: Ambulatory Visit | Attending: Orthopedic Surgery | Admitting: Orthopedic Surgery

## 2015-02-12 ENCOUNTER — Other Ambulatory Visit: Payer: Self-pay | Admitting: Orthopedic Surgery

## 2015-02-12 VITALS — BP 127/82 | HR 76 | Resp 12

## 2015-02-12 DIAGNOSIS — M545 Low back pain, unspecified: Secondary | ICD-10-CM

## 2015-02-12 DIAGNOSIS — G8929 Other chronic pain: Secondary | ICD-10-CM

## 2015-11-09 ENCOUNTER — Emergency Department: Payer: PRIVATE HEALTH INSURANCE

## 2015-11-09 ENCOUNTER — Encounter: Payer: Self-pay | Admitting: Emergency Medicine

## 2015-11-09 ENCOUNTER — Emergency Department
Admission: EM | Admit: 2015-11-09 | Discharge: 2015-11-09 | Disposition: A | Discharge status: Home / Self Care | Payer: PRIVATE HEALTH INSURANCE | Attending: Emergency Medicine | Admitting: Emergency Medicine

## 2015-11-09 DIAGNOSIS — R42 Dizziness and giddiness: Secondary | ICD-10-CM

## 2015-11-09 DIAGNOSIS — Z79899 Other long term (current) drug therapy: Secondary | ICD-10-CM | POA: Diagnosis not present

## 2015-11-09 DIAGNOSIS — I10 Essential (primary) hypertension: Secondary | ICD-10-CM | POA: Diagnosis not present

## 2015-11-09 LAB — URINALYSIS COMPLETE WITH MICROSCOPIC (ARMC ONLY)
BILIRUBIN URINE: NEGATIVE
Glucose, UA: NEGATIVE mg/dL
HGB URINE DIPSTICK: NEGATIVE
KETONES UR: NEGATIVE mg/dL
Nitrite: NEGATIVE
Protein, ur: NEGATIVE mg/dL
Specific Gravity, Urine: 1.011 (ref 1.005–1.030)
pH: 9 — ABNORMAL HIGH (ref 5.0–8.0)

## 2015-11-09 LAB — BASIC METABOLIC PANEL
Anion gap: 7 (ref 5–15)
BUN: 12 mg/dL (ref 6–20)
CALCIUM: 9.8 mg/dL (ref 8.9–10.3)
CHLORIDE: 101 mmol/L (ref 101–111)
CO2: 30 mmol/L (ref 22–32)
Creatinine, Ser: 0.93 mg/dL (ref 0.44–1.00)
GFR calc non Af Amer: >60 mL/min (ref >60)
Glucose, Bld: 122 mg/dL — ABNORMAL HIGH (ref 65–99)
Potassium: 3.4 mmol/L — ABNORMAL LOW (ref 3.5–5.1)
Sodium: 138 mmol/L (ref 135–145)

## 2015-11-09 LAB — CBC
HCT: 44 % (ref 35.0–47.0)
HEMOGLOBIN: 14.6 g/dL (ref 12.0–16.0)
MCH: 27.8 pg (ref 26.0–34.0)
MCHC: 33.2 g/dL (ref 32.0–36.0)
MCV: 83.5 fL (ref 80.0–100.0)
Platelets: 286 10*3/uL (ref 150–440)
RBC: 5.27 MIL/uL — AB (ref 3.80–5.20)
RDW: 13.5 % (ref 11.5–14.5)
WBC: 5.6 10*3/uL (ref 3.6–11.0)

## 2015-11-09 MED ORDER — MECLIZINE HCL 25 MG PO TABS: 25.0000 mg | ORAL_TABLET | Freq: Three times a day (TID) | ORAL | 0 refills | Status: AC | PRN

## 2015-11-09 MED ORDER — MECLIZINE HCL 25 MG PO TABS
25.0000 mg | ORAL_TABLET | ORAL | Status: AC
  Administered 2015-11-09: 25 mg via ORAL
  Filled 2015-11-09 (×2): qty 1

## 2015-11-09 NOTE — ED Notes (Signed)
Pt discharged home after verbalizing understanding of discharge instructions; nad noted. 

## 2015-11-09 NOTE — ED Notes (Signed)
Pt reports dizziness, nausea, vomiting today. States she was feeling OK this morning but began to feel dizzy in the shower. Pt reports 2-3 episodes of emesis. Denies pain.

## 2015-11-09 NOTE — ED Triage Notes (Signed)
Patient presents to the ED with hypertension, dizziness, and nausea and vomiting.  Patient reports eating breakfast this morning and feeling normal and then getting in the shower and becoming very dizzy and nauseous at that time, feeling light headed like she would pass out.  Patient denies history of vertigo.  Patient reports dizziness is worse with standing.  Patient denies blurred vision, denies headache, grip strength and leg lifts are equal and smile is symmetrical.  Patient is in no obvious distress at this time.

## 2015-11-09 NOTE — ED Provider Notes (Signed)
[facility] Emergency Department Provider Note  ____________________________________________   First MD Initiated Contact with Patient 11/09/15 1531     (approximate)  I have reviewed the triage vital signs and the nursing notes.   HISTORY  Chief Complaint Dizziness    HPI Laura Wright is a 65 y.o. female who presents for evaluation of acute onset dizziness, imbalance, gait instability, with several episodes of nausea and vomiting.  She reports that the symptoms started acutely when she was taking a shower this morning and that they were immediately severe.  She did not fall down but felt like she was going to.  The symptoms improved with nothing came back again at least a strong a little bit later.  She still feels lightheaded and dizzy which is much worse when she stands up or tries to ambulate and improved with lying down.  She is not having any visual disturbances and denies a headache.  She has had a couple of episodes of vomiting.  She denies chest pain, shortness of breath, abdominal pain, dysuria.  She has never had similar symptoms in the past although she has had trigeminal neuralgia and had some surgery in the past to try to resolve the neuralgia.  She reports that her father has peripheral vertigo.She has had no numbness or tingling in her extremities, no weakness in her arms or legs of which she is aware, and is having no difficulty speaking or swallowing.   Past Medical History:  Diagnosis Date  . Anxiety   . Complex tear of medial meniscus of right knee as current injury 12/30/2011  . Dental crowns present    x 2  . Headache(784.0)    due to trigeminal neuralgia and trauma of surgery  . Herpes simplex type 1 infection   . Hypertension    dx'ed in last six months  . Knee dislocation 12/2011   right  . Localized osteoarthritis of right knee 12/30/2011  . Trigeminal neuralgia     Patient Active Problem List   Diagnosis Date Noted  .  Overweight 10/01/2014  . Anxiety, generalized 04/24/2014  . 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 Blood in the urine 10/12/2013  . Congenital nystagmus 10/12/2013  . Dermatophytic onychia 10/12/2013  . Essential (primary) hypertension 10/12/2013  . External hemorrhoids without complication [id number]  . Hypoactive sexual desire 10/12/2013  . Ingrowing nail 10/12/2013  . LBP (low back pain) 10/12/2013  . Nasal vestibulitis 10/12/2013  . Migraine without aura and responsive to treatment 10/12/2013  . Excess weight 10/12/2013  . Pain in the wrist 10/12/2013  . Pain in rectum 10/12/2013  . Bleeding per rectum 10/12/2013  . Atheroma, skin 10/12/2013  . Hemorrhoids, residual tags 10/12/2013  . Menopausal symptom 10/12/2013  . Buzzing in ear 10/12/2013  . Fothergill's neuralgia 10/12/2013  . Knee osteoarthritis 01/01/2013  . Complex tear of medial meniscus of right knee as current injury 12/30/2011  . Localized osteoarthritis of right knee 12/30/2011    Past Surgical History:  Procedure Laterality Date  . ABDOMINAL HYSTERECTOMY  1986   partial  . COLONOSCOPY    . KNEE ARTHROSCOPY WITH MEDIAL MENISECTOMY  12/30/2011   Procedure: KNEE ARTHROSCOPY WITH MEDIAL MENISECTOMY;  Surgeon: Johnny Bridge, MD;  Location: [place];  Service: Orthopedics;  Laterality: Right;  RIGHT KNEE SCOPE Partial MEDIAL MENISCECTOMY  . PARTIAL KNEE ARTHROPLASTY Right 01/01/2013   Procedure: UNICOMPARTMENTAL KNEE;  Surgeon: Johnny Bridge, MD;  Location: [place];  Service: Orthopedics;  Laterality: Right;  . TRIGEMINAL NERVE DECOMPRESSION  04/2004  . TUBAL LIGATION      Prior to Admission medications   Medication Sig Start Date End Date Taking? Authorizing Provider  calcium carbonate (OS-CAL) 600 MG TABS Take 600 mg by mouth daily.     Historical Provider, MD  clonazePAM (KLONOPIN) 0.5 MG tablet Take 0.5 mg by mouth 3 (three) times daily as needed for anxiety.    Historical Provider, MD    enoxaparin (LOVENOX) 30 MG/0.3ML injection Inject 0.3 mLs (30 mg total) into the skin every 12 (twelve) hours. 01/01/13   Marchia Bond, MD  Estradiol (ELESTRIN) 0.52 MG/0.87 GM (0.06%) GEL Apply 1 application topically daily. Applied to arm    Historical Provider, MD  lisinopril-hydrochlorothiazide (PRINZIDE,ZESTORETIC) 10-12.5 MG per tablet Take 1 tablet by mouth daily.    Historical Provider, MD  meclizine (ANTIVERT) 25 MG tablet Take 1 tablet (25 mg total) by mouth 3 (three) times daily as needed for dizziness. 11/09/15   Hinda Kehr, MD  methocarbamol (ROBAXIN) 500 MG tablet Take 1 tablet (500 mg total) by mouth 4 (four) times daily. 01/01/13   Marchia Bond, MD  Multiple Vitamin (MULTIVITAMIN) tablet Take 1 tablet by mouth daily.    Historical Provider, MD  oxyCODONE-acetaminophen (PERCOCET) 10-325 MG per tablet Take 1-2 tablets by mouth every 6 (six) hours as needed for pain. MAXIMUM TOTAL ACETAMINOPHEN DOSE IS 4000 MG PER DAY 01/01/13   Marchia Bond, MD  promethazine (PHENERGAN) 25 MG tablet Take 1 tablet (25 mg total) by mouth every 6 (six) hours as needed for nausea or vomiting. 01/01/13   Marchia Bond, MD  sennosides-docusate sodium (SENOKOT-S) 8.6-50 MG tablet Take 2 tablets by mouth daily. 01/01/13   Marchia Bond, MD  thiamine (VITAMIN B-1) 100 MG tablet Take 100 mg by mouth daily.    Historical Provider, MD  valACYclovir (VALTREX) 1000 MG tablet Take 500 mg by mouth daily.    Historical Provider, MD  vitamin C (ASCORBIC ACID) 500 MG tablet Take 500 mg by mouth daily.    Historical Provider, MD  vitamin E 100 UNIT capsule Take 100 Units by mouth daily.    Historical Provider, MD    Allergies Macrodantin [nitrofurantoin macrocrystal]  Family History  Problem Relation Age of Onset  . Anesthesia problems Daughter     post-op N/V    Social History Social History  Substance Use Topics  . Smoking status: Never Smoker  . Smokeless tobacco: Never Used  . Alcohol use No     Review of Systems Constitutional: No fever/chills Eyes: No visual changes. ENT: No sore throat. Cardiovascular: Denies chest pain. Respiratory: Denies shortness of breath. Gastrointestinal: No abdominal pain.  No nausea, no vomiting.  No diarrhea.  No constipation. Genitourinary: Negative for dysuria. Musculoskeletal: Negative for back pain. Skin: Negative for rash. Neurological: Negative for headaches, focal weakness or numbness.  Difficulty with balance and ambulation, persistent dizziness worse when she stands up.  10-point ROS otherwise negative.  ____________________________________________   PHYSICAL EXAM:  VITAL SIGNS: ED Triage Vitals [11/09/15 1333]  Enc Vitals Group     BP (!) 175/97     Pulse Rate 81     Resp 18     Temp 98 F (36.7 C)     Temp Source Oral     SpO2 99 %     Weight 165 lb (74.8 kg)     Height 5\' 5"  (1.651 m)     Head Circumference  Peak Flow      Pain Score 0     Pain Loc      Pain Edu?      Excl. in [place]?     Constitutional: Alert and oriented. Well appearing and in no acute distress But does appear uncomfortable Eyes: Conjunctivae are normal. PERRL. EOMI. horizontal nystagmus at extremes of lateral gaze.  No vertical nystagmus. Head: Atraumatic. Nose: No congestion/rhinnorhea. Mouth/Throat: Mucous membranes are moist.  Oropharynx non-erythematous. Neck: No stridor.  No meningeal signs.   Cardiovascular: Normal rate, regular rhythm. Good peripheral circulation. Grossly normal heart sounds. Respiratory: Normal respiratory effort.  No retractions. Lungs CTAB. Gastrointestinal: Soft and nontender. No distention.  Musculoskeletal: No lower extremity tenderness nor edema. No gross deformities of extremities. Neurologic:  Normal speech and language. No gross focal neurologic deficits are appreciated.  No gross cranial nerve deficits.  She has no pronator drift.  She was somewhat off balance with the Romberg test, but she could not walk a  straight line without me having to catch her. Skin:  Skin is warm, dry and intact. No rash noted. Psychiatric: Mood and affect are normal. Speech and behavior are normal.  ____________________________________________   LABS (all labs ordered are listed, but only abnormal results are displayed)  Labs Reviewed  BASIC METABOLIC PANEL - Abnormal; Notable for the following:       Result Value   Potassium 3.4 (*)    Glucose, Bld 122 (*)    All other components within normal limits  CBC - Abnormal; Notable for the following:    RBC 5.27 (*)    All other components within normal limits  URINALYSIS COMPLETEWITH MICROSCOPIC ([facility] ONLY) - Abnormal; Notable for the following:    Color, Urine YELLOW (*)    APPearance CLOUDY (*)    pH 9.0 (*)    Leukocytes, UA 1+ (*)    Bacteria, UA RARE (*)    Squamous Epithelial / LPF 6-30 (*)    All other components within normal limits  URINE CULTURE  CBG MONITORING, ED   ____________________________________________  EKG  ED ECG REPORT I, Shaquella Stamant, the attending physician, personally viewed and interpreted this ECG.  Date: 11/09/2015 EKG Time: 13:40 Rate: 68 Rhythm: normal sinus rhythm QRS Axis: Left axis deviation Intervals: normal ST/T Wave abnormalities: Non-specific ST segment / T-wave changes, but no evidence of acute ischemia. Conduction Disturbances: none Narrative Interpretation: unremarkable  ____________________________________________  RADIOLOGY   Mr Angiogram Head Wo Contrast  Result Date: 11/09/2015 CLINICAL DATA:  Acute onset dizziness. EXAM: MRA HEAD WITHOUT CONTRAST TECHNIQUE: Angiographic images of the Circle of Willis were obtained using MRA technique without intravenous contrast. COMPARISON:  None. 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 Dominant left PICA and right AICA. Apparent symmetric narrowing of bilateral M2 branches is likely artifactual based on source images. No major branch  occlusion or treatable flow limiting stenosis. Negative for aneurysm. IMPRESSION: No acute arterial finding. No vertebrobasilar insufficiency or occlusion. Electronically Signed   By: Monte Fantasia M.D.   On: 11/09/2015 17:04   Mr Brain Wo Contrast  Result Date: 11/09/2015 CLINICAL DATA:  Acute onset dizziness with gait instability EXAM: MRI HEAD WITHOUT CONTRAST TECHNIQUE: Multiplanar, multiecho pulse sequences of the brain and surrounding structures were obtained without intravenous contrast. COMPARISON:  None available FINDINGS: Brain: No acute infarction, hemorrhage, hydrocephalus, extra-axial collection or mass lesion. Few FLAIR hyperintensities in the cerebral white matter, likely early chronic microvascular ischemia in this patient with history of  hypertension. Vascular: Arterial findings reported separately on MRA. Normal flow voids in the dural venous sinuses. Skull and upper cervical spine: Left retromastoid craniotomy for trigeminal nerve decompression. Sinuses/Orbits: Negative IMPRESSION: 1. No acute finding, including infarct. 2. Remote left retromastoid craniotomy for trigeminal nerve decompression. Electronically Signed   By: Monte Fantasia M.D.   On: 11/09/2015 16:57    ____________________________________________   PROCEDURES  Procedure(s) performed:   Procedures   Critical Care performed: No ____________________________________________   INITIAL IMPRESSION / ASSESSMENT AND PLAN / ED COURSE  Pertinent labs & imaging results that were available during my care of the patient were reviewed by me and considered in my medical decision making (see chart for details).  Her symptoms are more subtle then expected for benign peripheral vertigo, and I am concerned about the possibility of a cerebellar infarction.  I will proceed with MR brain and MR angiogram head, both without contrast, to try and rule out an acute CVA.  I have explained this to the patient and she is comfortable  with the plan.  Lab results are unremarkable except for the possibility of a mild urinary tract infection but she has more squamous epithelials than WBCs.  I will send a urine culture but do not anticipate treating empirically.   Clinical Course as of Nov 08 1813  [facility] Nov 09, 2015  1730 MR brain and MR angiogram had are both reassuring with no acute findings.  I passed this along to the patient and her family and we will now try some meclizine to see if this helps control her symptoms.  She is eager to go home now that she knows there is no evidence of CVA and I anticipate discharge soon. MR Brain Wo Contrast [CF]  1815 The patient states she feels better and wants to go home.  I gave my usual and customary return precautions.  She has a neurologist already with whom she can follow up and I also recommend she follow up with her primary care doctor.  [CF]    Clinical Course User Index [CF] Hinda Kehr, MD    ____________________________________________  FINAL CLINICAL IMPRESSION(S) / ED DIAGNOSES  Final diagnoses:  Vertigo     MEDICATIONS GIVEN DURING THIS VISIT:  Medications  meclizine (ANTIVERT) tablet 25 mg (25 mg Oral Given 11/09/15 1756)     NEW OUTPATIENT MEDICATIONS STARTED DURING THIS VISIT:  New Prescriptions   MECLIZINE (ANTIVERT) 25 MG TABLET    Take 1 tablet (25 mg total) by mouth 3 (three) times daily as needed for dizziness.    Modified Medications   No medications on file    Discontinued Medications   No medications on file     Note:  This document was prepared using Dragon voice recognition software and may include unintentional dictation errors.    Hinda Kehr, MD 11/09/15 [phone]

## 2015-11-09 NOTE — Discharge Instructions (Signed)
We believe your symptoms were caused by benign vertigo.  Your MRIs did not show any sign of a stroke or other abnormal finding in your brain.  Please read through the included information and take any prescribed medication(s).  Follow up with your doctor as listed above.  If you develop any new or worsening symptoms that concern you, including but not limited to persistent dizziness/vertigo, numbness or weakness in your arms or legs, altered mental status, persistent vomiting, or fever greater than 101, please return immediately to the Emergency Department.

## 2015-11-10 LAB — URINE CULTURE: SPECIAL REQUESTS: NORMAL

## 2016-01-11 ENCOUNTER — Other Ambulatory Visit: Payer: Self-pay | Admitting: Family

## 2016-01-11 DIAGNOSIS — E2839 Other primary ovarian failure: Secondary | ICD-10-CM

## 2018-01-17 ENCOUNTER — Other Ambulatory Visit: Payer: Self-pay | Admitting: Orthopedic Surgery

## 2018-01-31 ENCOUNTER — Other Ambulatory Visit: Payer: Self-pay | Admitting: Orthopedic Surgery

## 2018-02-01 NOTE — Patient Instructions (Signed)
Laura Wright  02/01/2018   Your procedure is scheduled on: 02-13-18  Report to [facility] Main  Entrance               Report to admitting at         0530 AM    Call this number if you have problems the morning of surgery [phone]    Remember: Do not eat food or drink liquids :After Midnight. BRUSH YOUR TEETH MORNING OF SURGERY AND RINSE YOUR MOUTH OUT, NO CHEWING GUM CANDY OR MINTS.     Take these medicines the morning of surgery with A SIP OF WATER: Valtrex                                You may not have any metal on your body including hair pins and              piercings  Do not wear jewelry, make-up, lotions, powders or perfumes, deodorant             Do not wear nail polish.  Do not shave  48 hours prior to surgery.     Do not bring valuables to the hospital. [place].  Contacts, dentures or bridgework may not be worn into surgery.  Leave suitcase in the car. After surgery it may be brought to your room.              Please read over the following fact sheets you were given: _____________________________________________________________________           [facility] - Preparing for Surgery Before surgery, you can play an important role.  Because skin is not sterile, your skin needs to be as free of germs as possible.  You can reduce the number of germs on your skin by washing with CHG (chlorahexidine gluconate) soap before surgery.  CHG is an antiseptic cleaner which kills germs and bonds with the skin to continue killing germs even after washing. Please DO NOT use if you have an allergy to CHG or antibacterial soaps.  If your skin becomes reddened/irritated stop using the CHG and inform your nurse when you arrive at Short Stay. Do not shave (including legs and underarms) for at least 48 hours prior to the first CHG shower.  You may shave your face/neck. Please follow these instructions  carefully:  1.  Shower with CHG Soap the night before surgery and the  morning of Surgery.  2.  If you choose to wash your hair, wash your hair first as usual with your  normal  shampoo.  3.  After you shampoo, rinse your hair and body thoroughly to remove the  shampoo.                           4.  Use CHG as you would any other liquid soap.  You can apply chg directly  to the skin and wash                       Gently with a scrungie or clean washcloth.  5.  Apply the CHG Soap to your body ONLY FROM THE NECK DOWN.  Do not use on face/ open                           Wound or open sores. Avoid contact with eyes, ears mouth and genitals (private parts).                       Wash face,  Genitals (private parts) with your normal soap.             6.  Wash thoroughly, paying special attention to the area where your surgery  will be performed.  7.  Thoroughly rinse your body with warm water from the neck down.  8.  DO NOT shower/wash with your normal soap after using and rinsing off  the CHG Soap.                9.  Pat yourself dry with a clean towel.            10.  Wear clean pajamas.            11.  Place clean sheets on your bed the night of your first shower and do not  sleep with pets. Day of Surgery : Do not apply any lotions/deodorants the morning of surgery.  Please wear clean clothes to the hospital/surgery center.  FAILURE TO FOLLOW THESE INSTRUCTIONS MAY RESULT IN THE CANCELLATION OF YOUR SURGERY PATIENT SIGNATURE_________________________________  NURSE SIGNATURE__________________________________  ________________________________________________________________________   Laura Wright  An incentive spirometer is a tool that can help keep your lungs clear and active. This tool measures how well you are filling your lungs with each breath. Taking long deep breaths may help reverse or decrease the chance of developing breathing (pulmonary) problems (especially infection)  following:  A long period of time when you are unable to move or be active. BEFORE THE PROCEDURE   If the spirometer includes an indicator to show your best effort, your nurse or respiratory therapist will set it to a desired goal.  If possible, sit up straight or lean slightly forward. Try not to slouch.  Hold the incentive spirometer in an upright position. INSTRUCTIONS FOR USE  1. Sit on the edge of your bed if possible, or sit up as far as you can in bed or on a chair. 2. Hold the incentive spirometer in an upright position. 3. Breathe out normally. 4. Place the mouthpiece in your mouth and seal your lips tightly around it. 5. Breathe in slowly and as deeply as possible, raising the piston or the ball toward the top of the column. 6. Hold your breath for 3-5 seconds or for as long as possible. Allow the piston or ball to fall to the bottom of the column. 7. Remove the mouthpiece from your mouth and breathe out normally. 8. Rest for a few seconds and repeat Steps 1 through 7 at least 10 times every 1-2 hours when you are awake. Take your time and take a few normal breaths between deep breaths. 9. The spirometer may include an indicator to show your best effort. Use the indicator as a goal to work toward during each repetition. 10. After each set of 10 deep breaths, practice coughing to be sure your lungs are clear. If you have an incision (the cut made at the time of surgery), support your incision when coughing by placing a pillow or rolled up towels firmly against it. Once you are able to get out of  bed, walk around indoors and cough well. You may stop using the incentive spirometer when instructed by your caregiver.  RISKS AND COMPLICATIONS  Take your time so you do not get dizzy or light-headed.  If you are in pain, you may need to take or ask for pain medication before doing incentive spirometry. It is harder to take a deep breath if you are having pain. AFTER USE  Rest and  breathe slowly and easily.  It can be helpful to keep track of a log of your progress. Your caregiver can provide you with a simple table to help with this. If you are using the spirometer at home, follow these instructions: Laura Wright IF:   You are having difficultly using the spirometer.  You have trouble using the spirometer as often as instructed.  Your pain medication is not giving enough relief while using the spirometer.  You develop fever of 100.5 F (38.1 C) or higher. SEEK IMMEDIATE MEDICAL CARE IF:   You cough up bloody sputum that had not been present before.  You develop fever of 102 F (38.9 C) or greater.  You develop worsening pain at or near the incision site. MAKE SURE YOU:   Understand these instructions.  Will watch your condition.  Will get help right away if you are not doing well or get worse. Document Released: 05/02/2006 Document Revised: 03/14/2011 Document Reviewed: 07/03/2006 [facility] Patient Information 2014 [place], [place].   ________________________________________________________________________

## 2018-02-06 ENCOUNTER — Encounter (HOSPITAL_COMMUNITY): Payer: Self-pay

## 2018-02-06 ENCOUNTER — Other Ambulatory Visit: Payer: Self-pay

## 2018-02-06 ENCOUNTER — Encounter (HOSPITAL_COMMUNITY)
Admission: RE | Admit: 2018-02-06 | Discharge: 2018-02-06 | Disposition: A | Discharge status: Home / Self Care | Payer: Medicare Other | Source: Ambulatory Visit | Attending: Orthopedic Surgery | Admitting: Orthopedic Surgery

## 2018-02-06 DIAGNOSIS — Z01818 Encounter for other preprocedural examination: Secondary | ICD-10-CM | POA: Insufficient documentation

## 2018-02-06 HISTORY — DX: Family history of other specified conditions: Z84.89

## 2018-02-06 LAB — CBC
HCT: 42.7 % (ref 36.0–46.0)
Hemoglobin: 13.2 g/dL (ref 12.0–15.0)
MCH: 27.8 pg (ref 26.0–34.0)
MCHC: 30.9 g/dL (ref 30.0–36.0)
MCV: 90.1 fL (ref 80.0–100.0)
PLATELETS: 319 10*3/uL (ref 150–400)
RBC: 4.74 MIL/uL (ref 3.87–5.11)
RDW: 13.6 % (ref 11.5–15.5)
WBC: 7.7 10*3/uL (ref 4.0–10.5)
nRBC: 0 % (ref 0.0–0.2)

## 2018-02-06 LAB — BASIC METABOLIC PANEL
ANION GAP: 8 (ref 5–15)
BUN: 21 mg/dL (ref 8–23)
CO2: 30 mmol/L (ref 22–32)
Calcium: 9.4 mg/dL (ref 8.9–10.3)
Chloride: 101 mmol/L (ref 98–111)
Creatinine, Ser: 0.89 mg/dL (ref 0.44–1.00)
GFR calc Af Amer: >60 mL/min (ref >60)
GFR calc non Af Amer: >60 mL/min (ref >60)
Glucose, Bld: 94 mg/dL (ref 70–99)
Potassium: 4 mmol/L (ref 3.5–5.1)
Sodium: 139 mmol/L (ref 135–145)

## 2018-02-06 LAB — SURGICAL PCR SCREEN
MRSA, PCR: NEGATIVE
STAPHYLOCOCCUS AUREUS: NEGATIVE

## 2018-02-13 ENCOUNTER — Observation Stay (HOSPITAL_COMMUNITY): Payer: Medicare Other

## 2018-02-13 ENCOUNTER — Other Ambulatory Visit: Payer: Self-pay

## 2018-02-13 ENCOUNTER — Encounter (HOSPITAL_COMMUNITY): Payer: Self-pay | Admitting: Certified Registered Nurse Anesthetist

## 2018-02-13 ENCOUNTER — Inpatient Hospital Stay (HOSPITAL_COMMUNITY)
Admission: RE | Admit: 2018-02-13 | Discharge: 2018-02-14 | DRG: 470 | Disposition: A | Discharge status: Home Health | Payer: Medicare Other | Attending: Orthopedic Surgery | Admitting: Orthopedic Surgery

## 2018-02-13 ENCOUNTER — Encounter (HOSPITAL_COMMUNITY)
Admission: RE | Disposition: A | Discharge status: Home Health | Payer: Self-pay | Source: Home / Self Care | Attending: Orthopedic Surgery

## 2018-02-13 ENCOUNTER — Inpatient Hospital Stay (HOSPITAL_COMMUNITY): Payer: Medicare Other | Admitting: Certified Registered Nurse Anesthetist

## 2018-02-13 ENCOUNTER — Inpatient Hospital Stay (HOSPITAL_COMMUNITY): Payer: Medicare Other | Admitting: Physician Assistant

## 2018-02-13 DIAGNOSIS — Z79899 Other long term (current) drug therapy: Secondary | ICD-10-CM | POA: Diagnosis not present

## 2018-02-13 DIAGNOSIS — Z96641 Presence of right artificial hip joint: Secondary | ICD-10-CM

## 2018-02-13 DIAGNOSIS — M1711 Unilateral primary osteoarthritis, right knee: Secondary | ICD-10-CM | POA: Diagnosis present

## 2018-02-13 DIAGNOSIS — M1611 Unilateral primary osteoarthritis, right hip: Secondary | ICD-10-CM | POA: Diagnosis present

## 2018-02-13 DIAGNOSIS — Z96649 Presence of unspecified artificial hip joint: Secondary | ICD-10-CM

## 2018-02-13 DIAGNOSIS — F419 Anxiety disorder, unspecified: Secondary | ICD-10-CM | POA: Diagnosis present

## 2018-02-13 DIAGNOSIS — G500 Trigeminal neuralgia: Secondary | ICD-10-CM | POA: Diagnosis present

## 2018-02-13 DIAGNOSIS — I10 Essential (primary) hypertension: Secondary | ICD-10-CM | POA: Diagnosis present

## 2018-02-13 HISTORY — PX: TOTAL HIP ARTHROPLASTY: SHX124

## 2018-02-13 HISTORY — DX: Unilateral primary osteoarthritis, right hip: M16.11

## 2018-02-13 SURGERY — ARTHROPLASTY, HIP, TOTAL,POSTERIOR APPROACH
Anesthesia: Spinal | Site: Hip | Laterality: Right

## 2018-02-13 MED ORDER — ACETAMINOPHEN 500 MG PO TABS
1000.0000 mg | ORAL_TABLET | Freq: Four times a day (QID) | ORAL | Status: AC
  Administered 2018-02-13 – 2018-02-14 (×4): 1000 mg via ORAL
  Filled 2018-02-13 (×3): qty 2

## 2018-02-13 MED ORDER — HYDROMORPHONE HCL 1 MG/ML IJ SOLN
0.5000 mg | INTRAMUSCULAR | Status: DC | PRN
  Administered 2018-02-13: 0.5 mg via INTRAVENOUS

## 2018-02-13 MED ORDER — ONDANSETRON HCL 4 MG/2ML IJ SOLN
INTRAMUSCULAR | Status: AC
  Filled 2018-02-13: qty 2

## 2018-02-13 MED ORDER — ASPIRIN EC 325 MG PO TBEC: 325.0000 mg | DELAYED_RELEASE_TABLET | Freq: Two times a day (BID) | ORAL | 0 refills | Status: DC

## 2018-02-13 MED ORDER — CALCIUM-MAGNESIUM-VITAMIN D ER 600-40-500 MG-MG-UNIT PO TB24: ORAL_TABLET | Freq: Every day | ORAL | Status: DC

## 2018-02-13 MED ORDER — PHENYLEPHRINE HCL 10 MG/ML IJ SOLN
INTRAMUSCULAR | Status: AC
  Filled 2018-02-13: qty 1

## 2018-02-13 MED ORDER — ACETAMINOPHEN 325 MG PO TABS: 325.0000 mg | ORAL_TABLET | Freq: Four times a day (QID) | ORAL | Status: DC | PRN

## 2018-02-13 MED ORDER — TRANEXAMIC ACID-NACL 1000-0.7 MG/100ML-% IV SOLN: 1000.0000 mg | INTRAVENOUS | Status: DC

## 2018-02-13 MED ORDER — CEFAZOLIN SODIUM-DEXTROSE 2-4 GM/100ML-% IV SOLN
2.0000 g | Freq: Four times a day (QID) | INTRAVENOUS | Status: AC
  Administered 2018-02-13 (×2): 2 g via INTRAVENOUS
  Filled 2018-02-13 (×2): qty 100

## 2018-02-13 MED ORDER — PROPOFOL 10 MG/ML IV BOLUS
INTRAVENOUS | Status: DC | PRN
  Administered 2018-02-13: 25 mg via INTRAVENOUS
  Administered 2018-02-13: 20 mg via INTRAVENOUS

## 2018-02-13 MED ORDER — MAGNESIUM CITRATE PO SOLN: 1.0000 | Freq: Once | ORAL | Status: DC | PRN

## 2018-02-13 MED ORDER — CEFAZOLIN SODIUM-DEXTROSE 2-4 GM/100ML-% IV SOLN
INTRAVENOUS | Status: AC
  Filled 2018-02-13: qty 100

## 2018-02-13 MED ORDER — FENTANYL CITRATE (PF) 100 MCG/2ML IJ SOLN
INTRAMUSCULAR | Status: DC | PRN
  Administered 2018-02-13 (×2): 50 ug via INTRAVENOUS

## 2018-02-13 MED ORDER — OXYCODONE HCL 5 MG PO TABS: 5.0000 mg | ORAL_TABLET | ORAL | 0 refills | Status: DC | PRN

## 2018-02-13 MED ORDER — KETOROLAC TROMETHAMINE 15 MG/ML IJ SOLN
INTRAMUSCULAR | Status: AC
  Filled 2018-02-13: qty 1

## 2018-02-13 MED ORDER — BISACODYL 10 MG RE SUPP: 10.0000 mg | Freq: Every day | RECTAL | Status: DC | PRN

## 2018-02-13 MED ORDER — FENTANYL CITRATE (PF) 100 MCG/2ML IJ SOLN
INTRAMUSCULAR | Status: AC
  Filled 2018-02-13: qty 2

## 2018-02-13 MED ORDER — HYDROCHLOROTHIAZIDE 12.5 MG PO CAPS
12.5000 mg | ORAL_CAPSULE | Freq: Every day | ORAL | Status: DC
  Administered 2018-02-14: 12.5 mg via ORAL
  Filled 2018-02-13: qty 1

## 2018-02-13 MED ORDER — ACETAMINOPHEN 500 MG PO TABS
ORAL_TABLET | ORAL | Status: AC
  Administered 2018-02-13: 1000 mg via ORAL
  Filled 2018-02-13: qty 2

## 2018-02-13 MED ORDER — ONDANSETRON HCL 4 MG/2ML IJ SOLN
4.0000 mg | Freq: Four times a day (QID) | INTRAMUSCULAR | Status: DC | PRN
  Administered 2018-02-14: 4 mg via INTRAVENOUS
  Filled 2018-02-13: qty 2

## 2018-02-13 MED ORDER — PHENYLEPHRINE 40 MCG/ML (10ML) SYRINGE FOR IV PUSH (FOR BLOOD PRESSURE SUPPORT)
PREFILLED_SYRINGE | INTRAVENOUS | Status: DC | PRN
  Administered 2018-02-13 (×4): 80 ug via INTRAVENOUS

## 2018-02-13 MED ORDER — BUPIVACAINE IN DEXTROSE 0.75-8.25 % IT SOLN
INTRATHECAL | Status: DC | PRN
  Administered 2018-02-13: 1.8 mL via INTRATHECAL

## 2018-02-13 MED ORDER — LACTATED RINGERS IV SOLN
INTRAVENOUS | Status: DC
  Administered 2018-02-13 (×3): via INTRAVENOUS

## 2018-02-13 MED ORDER — METHOCARBAMOL 500 MG IVPB - SIMPLE MED
INTRAVENOUS | Status: AC
  Filled 2018-02-13: qty 50

## 2018-02-13 MED ORDER — MEPERIDINE HCL 50 MG/ML IJ SOLN: 6.2500 mg | INTRAMUSCULAR | Status: DC | PRN

## 2018-02-13 MED ORDER — ONDANSETRON HCL 4 MG PO TABS: 4.0000 mg | ORAL_TABLET | Freq: Three times a day (TID) | ORAL | 0 refills | Status: DC | PRN

## 2018-02-13 MED ORDER — LOSARTAN POTASSIUM-HCTZ 50-12.5 MG PO TABS: 1.0000 | ORAL_TABLET | Freq: Every day | ORAL | Status: DC

## 2018-02-13 MED ORDER — VALACYCLOVIR HCL 500 MG PO TABS
500.0000 mg | ORAL_TABLET | Freq: Every day | ORAL | Status: DC
  Administered 2018-02-14: 500 mg via ORAL
  Filled 2018-02-13: qty 1

## 2018-02-13 MED ORDER — ONDANSETRON HCL 4 MG PO TABS
4.0000 mg | ORAL_TABLET | Freq: Four times a day (QID) | ORAL | Status: DC | PRN
  Filled 2018-02-13: qty 1

## 2018-02-13 MED ORDER — STERILE WATER FOR IRRIGATION IR SOLN
Status: DC | PRN
  Administered 2018-02-13: 3000 mL

## 2018-02-13 MED ORDER — ADULT MULTIVITAMIN W/MINERALS CH: 1.0000 | ORAL_TABLET | Freq: Every day | ORAL | Status: DC

## 2018-02-13 MED ORDER — PHENYLEPHRINE 40 MCG/ML (10ML) SYRINGE FOR IV PUSH (FOR BLOOD PRESSURE SUPPORT)
PREFILLED_SYRINGE | INTRAVENOUS | Status: AC
  Filled 2018-02-13: qty 10

## 2018-02-13 MED ORDER — METHOCARBAMOL 500 MG IVPB - SIMPLE MED
500.0000 mg | Freq: Four times a day (QID) | INTRAVENOUS | Status: DC | PRN
  Administered 2018-02-13: 500 mg via INTRAVENOUS
  Filled 2018-02-13: qty 50

## 2018-02-13 MED ORDER — HYDROMORPHONE HCL 1 MG/ML IJ SOLN
INTRAMUSCULAR | Status: AC
  Administered 2018-02-13: 0.25 mg via INTRAVENOUS
  Filled 2018-02-13: qty 1

## 2018-02-13 MED ORDER — DOCUSATE SODIUM 100 MG PO CAPS
100.0000 mg | ORAL_CAPSULE | Freq: Two times a day (BID) | ORAL | Status: DC
  Administered 2018-02-13 – 2018-02-14 (×2): 100 mg via ORAL
  Filled 2018-02-13 (×2): qty 1

## 2018-02-13 MED ORDER — ZOLPIDEM TARTRATE 5 MG PO TABS: 5.0000 mg | ORAL_TABLET | Freq: Every evening | ORAL | Status: DC | PRN

## 2018-02-13 MED ORDER — PROPOFOL 10 MG/ML IV BOLUS
INTRAVENOUS | Status: AC
  Filled 2018-02-13: qty 20

## 2018-02-13 MED ORDER — POTASSIUM CHLORIDE IN NACL 20-0.45 MEQ/L-% IV SOLN
INTRAVENOUS | Status: DC
  Administered 2018-02-13 – 2018-02-14 (×2): via INTRAVENOUS
  Filled 2018-02-13 (×2): qty 1000

## 2018-02-13 MED ORDER — KETOROLAC TROMETHAMINE 15 MG/ML IJ SOLN
7.5000 mg | Freq: Four times a day (QID) | INTRAMUSCULAR | Status: AC
  Administered 2018-02-13 – 2018-02-14 (×4): 7.5 mg via INTRAVENOUS
  Filled 2018-02-13 (×3): qty 1

## 2018-02-13 MED ORDER — METOCLOPRAMIDE HCL 5 MG PO TABS
5.0000 mg | ORAL_TABLET | Freq: Three times a day (TID) | ORAL | Status: DC | PRN
  Filled 2018-02-13: qty 2

## 2018-02-13 MED ORDER — METHOCARBAMOL 500 MG PO TABS
500.0000 mg | ORAL_TABLET | Freq: Four times a day (QID) | ORAL | Status: DC | PRN
  Administered 2018-02-13 – 2018-02-14 (×3): 500 mg via ORAL
  Filled 2018-02-13 (×3): qty 1

## 2018-02-13 MED ORDER — OXYCODONE HCL 5 MG PO TABS
5.0000 mg | ORAL_TABLET | ORAL | Status: DC | PRN
  Administered 2018-02-13: 10 mg via ORAL
  Administered 2018-02-13: 5 mg via ORAL
  Administered 2018-02-14 (×2): 10 mg via ORAL
  Filled 2018-02-13 (×3): qty 2

## 2018-02-13 MED ORDER — BACLOFEN 10 MG PO TABS: 10.0000 mg | ORAL_TABLET | Freq: Three times a day (TID) | ORAL | 0 refills | Status: DC

## 2018-02-13 MED ORDER — DEXAMETHASONE SODIUM PHOSPHATE 10 MG/ML IJ SOLN
INTRAMUSCULAR | Status: AC
  Filled 2018-02-13: qty 1

## 2018-02-13 MED ORDER — DEXAMETHASONE SODIUM PHOSPHATE 10 MG/ML IJ SOLN
INTRAMUSCULAR | Status: DC | PRN
  Administered 2018-02-13: 4 mg via INTRAVENOUS

## 2018-02-13 MED ORDER — ASPIRIN EC 325 MG PO TBEC
325.0000 mg | DELAYED_RELEASE_TABLET | Freq: Two times a day (BID) | ORAL | Status: DC
  Administered 2018-02-13 – 2018-02-14 (×2): 325 mg via ORAL
  Filled 2018-02-13 (×2): qty 1

## 2018-02-13 MED ORDER — METOCLOPRAMIDE HCL 5 MG/ML IJ SOLN: 5.0000 mg | Freq: Three times a day (TID) | INTRAMUSCULAR | Status: DC | PRN

## 2018-02-13 MED ORDER — 0.9 % SODIUM CHLORIDE (POUR BTL) OPTIME
TOPICAL | Status: DC | PRN
  Administered 2018-02-13: 1000 mL

## 2018-02-13 MED ORDER — MIDAZOLAM HCL 2 MG/2ML IJ SOLN
INTRAMUSCULAR | Status: AC
  Filled 2018-02-13: qty 2

## 2018-02-13 MED ORDER — LOSARTAN POTASSIUM 50 MG PO TABS
50.0000 mg | ORAL_TABLET | Freq: Every day | ORAL | Status: DC
  Administered 2018-02-14: 50 mg via ORAL
  Filled 2018-02-13: qty 1

## 2018-02-13 MED ORDER — TRANEXAMIC ACID-NACL 1000-0.7 MG/100ML-% IV SOLN
INTRAVENOUS | Status: AC
  Filled 2018-02-13: qty 100

## 2018-02-13 MED ORDER — POLYETHYLENE GLYCOL 3350 17 G PO PACK: 17.0000 g | PACK | Freq: Every day | ORAL | Status: DC | PRN

## 2018-02-13 MED ORDER — BUPIVACAINE HCL (PF) 0.25 % IJ SOLN
INTRAMUSCULAR | Status: AC
  Filled 2018-02-13: qty 30

## 2018-02-13 MED ORDER — PROPOFOL 500 MG/50ML IV EMUL
INTRAVENOUS | Status: DC | PRN
  Administered 2018-02-13: 75 ug/kg/min via INTRAVENOUS

## 2018-02-13 MED ORDER — LORAZEPAM 0.5 MG PO TABS: 0.5000 mg | ORAL_TABLET | Freq: Every day | ORAL | Status: DC | PRN

## 2018-02-13 MED ORDER — PROPOFOL 10 MG/ML IV BOLUS
INTRAVENOUS | Status: AC
  Filled 2018-02-13: qty 60

## 2018-02-13 MED ORDER — SENNA-DOCUSATE SODIUM 8.6-50 MG PO TABS: 2.0000 | ORAL_TABLET | Freq: Every day | ORAL | 1 refills | Status: DC

## 2018-02-13 MED ORDER — OXYCODONE HCL 5 MG PO TABS
10.0000 mg | ORAL_TABLET | ORAL | Status: DC | PRN
  Administered 2018-02-13: 10 mg via ORAL
  Filled 2018-02-13: qty 2

## 2018-02-13 MED ORDER — CEFAZOLIN SODIUM-DEXTROSE 2-4 GM/100ML-% IV SOLN
2.0000 g | INTRAVENOUS | Status: AC
  Administered 2018-02-13: 2 g via INTRAVENOUS

## 2018-02-13 MED ORDER — MIDAZOLAM HCL 5 MG/5ML IJ SOLN
INTRAMUSCULAR | Status: DC | PRN
  Administered 2018-02-13: 2 mg via INTRAVENOUS

## 2018-02-13 MED ORDER — DIPHENHYDRAMINE HCL 12.5 MG/5ML PO ELIX: 12.5000 mg | ORAL_SOLUTION | ORAL | Status: DC | PRN

## 2018-02-13 MED ORDER — HYDROMORPHONE HCL 1 MG/ML IJ SOLN
INTRAMUSCULAR | Status: AC
  Filled 2018-02-13: qty 1

## 2018-02-13 MED ORDER — ESTRADIOL 0.52 MG/0.87 GM (0.06%) TD GEL: 1.0000 "application " | Freq: Every day | TRANSDERMAL | Status: DC

## 2018-02-13 MED ORDER — DEXAMETHASONE SODIUM PHOSPHATE 10 MG/ML IJ SOLN
10.0000 mg | Freq: Once | INTRAMUSCULAR | Status: AC
  Administered 2018-02-14: 10 mg via INTRAVENOUS
  Filled 2018-02-13: qty 1

## 2018-02-13 MED ORDER — MECLIZINE HCL 25 MG PO TABS: 25.0000 mg | ORAL_TABLET | Freq: Three times a day (TID) | ORAL | Status: DC | PRN

## 2018-02-13 MED ORDER — ONDANSETRON HCL 4 MG/2ML IJ SOLN
4.0000 mg | Freq: Once | INTRAMUSCULAR | Status: AC | PRN
  Administered 2018-02-13: 4 mg via INTRAVENOUS

## 2018-02-13 MED ORDER — MENTHOL 3 MG MT LOZG: 1.0000 | LOZENGE | OROMUCOSAL | Status: DC | PRN

## 2018-02-13 MED ORDER — KETOROLAC TROMETHAMINE 30 MG/ML IJ SOLN
INTRAMUSCULAR | Status: AC
  Filled 2018-02-13: qty 1

## 2018-02-13 MED ORDER — CHLORHEXIDINE GLUCONATE 4 % EX LIQD: 60.0000 mL | Freq: Once | CUTANEOUS | Status: DC

## 2018-02-13 MED ORDER — HYDROMORPHONE HCL 1 MG/ML IJ SOLN
0.2500 mg | INTRAMUSCULAR | Status: DC | PRN
  Administered 2018-02-13: 0.25 mg via INTRAVENOUS
  Administered 2018-02-13: 0.5 mg via INTRAVENOUS
  Administered 2018-02-13: 0.25 mg via INTRAVENOUS
  Administered 2018-02-13: 0.5 mg via INTRAVENOUS

## 2018-02-13 MED ORDER — OXYCODONE HCL 5 MG PO TABS
ORAL_TABLET | ORAL | Status: AC
  Administered 2018-02-13: 5 mg via ORAL
  Filled 2018-02-13: qty 1

## 2018-02-13 MED ORDER — KETOROLAC TROMETHAMINE 30 MG/ML IJ SOLN
INTRAMUSCULAR | Status: DC | PRN
  Administered 2018-02-13: 30 mg

## 2018-02-13 MED ORDER — ALUM & MAG HYDROXIDE-SIMETH 200-200-20 MG/5ML PO SUSP: 30.0000 mL | ORAL | Status: DC | PRN

## 2018-02-13 MED ORDER — CALCIUM CARBONATE-VITAMIN D 500-200 MG-UNIT PO TABS
1.0000 | ORAL_TABLET | Freq: Every day | ORAL | Status: DC
  Administered 2018-02-14: 1 via ORAL
  Filled 2018-02-13: qty 1

## 2018-02-13 MED ORDER — SODIUM CHLORIDE 0.9 % IV SOLN
INTRAVENOUS | Status: DC | PRN
  Administered 2018-02-13: 50 ug/min via INTRAVENOUS

## 2018-02-13 MED ORDER — BUPIVACAINE HCL (PF) 0.25 % IJ SOLN
INTRAMUSCULAR | Status: DC | PRN
  Administered 2018-02-13: 30 mL

## 2018-02-13 MED ORDER — TRANEXAMIC ACID-NACL 1000-0.7 MG/100ML-% IV SOLN
1000.0000 mg | Freq: Once | INTRAVENOUS | Status: AC
  Administered 2018-02-13: 1000 mg via INTRAVENOUS
  Filled 2018-02-13: qty 100

## 2018-02-13 MED ORDER — ONDANSETRON HCL 4 MG/2ML IJ SOLN
INTRAMUSCULAR | Status: DC | PRN
  Administered 2018-02-13: 4 mg via INTRAVENOUS

## 2018-02-13 MED ORDER — PHENOL 1.4 % MT LIQD: 1.0000 | OROMUCOSAL | Status: DC | PRN

## 2018-02-13 SURGICAL SUPPLY — 56 items
BIT DRILL 2.0X128 (BIT) ×2 IMPLANT
BIT DRILL 2.0X128MM (BIT) ×1
BLADE SAW SAG 73X25 THK (BLADE) ×1
BLADE SAW SGTL 73X25 THK (BLADE) ×2 IMPLANT
BLADE SURG SZ10 CARB STEEL (BLADE) ×6 IMPLANT
CLOSURE STERI-STRIP 1/2X4 (GAUZE/BANDAGES/DRESSINGS) ×1
CLOSURE WOUND 1/2 X4 (GAUZE/BANDAGES/DRESSINGS) ×2
CLSR STERI-STRIP ANTIMIC 1/2X4 (GAUZE/BANDAGES/DRESSINGS) ×2 IMPLANT
COVER SURGICAL LIGHT HANDLE (MISCELLANEOUS) ×3 IMPLANT
COVER WAND RF STERILE (DRAPES) IMPLANT
DRAPE INCISE IOBAN 66X45 STRL (DRAPES) ×6 IMPLANT
DRAPE ORTHO SPLIT 77X108 STRL (DRAPES) ×4
DRAPE POUCH INSTRU U-SHP 10X18 (DRAPES) ×3 IMPLANT
DRAPE SHEET LG 3/4 BI-LAMINATE (DRAPES) ×3 IMPLANT
DRAPE SURG 17X11 SM STRL (DRAPES) ×3 IMPLANT
DRAPE SURG ORHT 6 SPLT 77X108 (DRAPES) ×2 IMPLANT
DRAPE U-SHAPE 47X51 STRL (DRAPES) ×3 IMPLANT
DRSG MEPILEX BORDER 4X8 (GAUZE/BANDAGES/DRESSINGS) ×3 IMPLANT
DURAPREP 26ML APPLICATOR (WOUND CARE) ×6 IMPLANT
ELECT BLADE TIP CTD 4 INCH (ELECTRODE) ×3 IMPLANT
ELECT REM PT RETURN 15FT ADLT (MISCELLANEOUS) ×3 IMPLANT
ELIMINATOR HOLE APEX DEPUY (Hips) ×3 IMPLANT
GLOVE BIO SURGEON STRL SZ7.5 (GLOVE) ×3 IMPLANT
GLOVE BIO SURGEON STRL SZ8 (GLOVE) ×3 IMPLANT
GLOVE BIOGEL PI IND STRL 8 (GLOVE) ×2 IMPLANT
GLOVE BIOGEL PI INDICATOR 8 (GLOVE) ×4
GOWN STRL REUS W/TWL 2XL LVL3 (GOWN DISPOSABLE) ×3 IMPLANT
GOWN STRL REUS W/TWL LRG LVL3 (GOWN DISPOSABLE) ×3 IMPLANT
HEAD M SROM 36MM PLUS 1.5 (Hips) ×1 IMPLANT
HOOD PEEL AWAY FLYTE STAYCOOL (MISCELLANEOUS) ×9 IMPLANT
KIT BASIN OR (CUSTOM PROCEDURE TRAY) ×3 IMPLANT
LINER NEUTRAL 52X36MM PLUS 4 (Liner) ×3 IMPLANT
MANIFOLD NEPTUNE II (INSTRUMENTS) ×3 IMPLANT
NEEDLE HYPO 22GX1.5 SAFETY (NEEDLE) ×3 IMPLANT
PACK TOTAL JOINT (CUSTOM PROCEDURE TRAY) ×3 IMPLANT
PIN SECTOR W/GRIP ACE CUP 52MM (Hips) ×3 IMPLANT
PROTECTOR NERVE ULNAR (MISCELLANEOUS) ×3 IMPLANT
RETRIEVER SUT HEWSON (MISCELLANEOUS) ×3 IMPLANT
SCREW 6.5MMX25MM (Screw) ×3 IMPLANT
SROM M HEAD 36MM PLUS 1.5 (Hips) ×3 IMPLANT
STEM SUMMIT STD 4 (Stem) ×3 IMPLANT
STRIP CLOSURE SKIN 1/2X4 (GAUZE/BANDAGES/DRESSINGS) ×4 IMPLANT
SUCTION FRAZIER HANDLE 12FR (TUBING) ×2
SUCTION TUBE FRAZIER 12FR DISP (TUBING) ×1 IMPLANT
SUT FIBERWIRE #2 38 REV NDL BL (SUTURE) ×9
SUT MNCRL AB 4-0 PS2 18 (SUTURE) IMPLANT
SUT VIC AB 0 CT1 36 (SUTURE) ×3 IMPLANT
SUT VIC AB 2-0 CT1 27 (SUTURE) ×4
SUT VIC AB 2-0 CT1 TAPERPNT 27 (SUTURE) ×2 IMPLANT
SUT VIC AB 3-0 SH 8-18 (SUTURE) ×3 IMPLANT
SUTURE FIBERWR#2 38 REV NDL BL (SUTURE) ×3 IMPLANT
SYR CONTROL 10ML LL (SYRINGE) ×3 IMPLANT
TOWEL OR 17X26 10 PK STRL BLUE (TOWEL DISPOSABLE) ×6 IMPLANT
TOWEL OR NON WOVEN STRL DISP B (DISPOSABLE) ×3 IMPLANT
TRAY FOLEY MTR SLVR 14FR STAT (SET/KITS/TRAYS/PACK) ×3 IMPLANT
YANKAUER SUCT BULB TIP 10FT TU (MISCELLANEOUS) ×3 IMPLANT

## 2018-02-13 NOTE — Evaluation (Signed)
Physical Therapy Evaluation Patient Details Name: Laura Wright MRN: [id number] DOB: July 13, 1950 Today's Date: 02/13/2018   History of Present Illness  68 yo female s/p R THA posterior approach on 02/13/18. PMH includes anxiety, headaches, HTN, R knee dislocation with hx of meniscal repair and UKR 2014, trigeminal neuralgia with decompression 2006.  Clinical Impression   Pt presents with R hip pain, decreased knowledge of precautions, increased time and effort to perform mobility tasks, and decreased tolerance for ambulation due to R hip pain. Pt to benefit from acute PT to address deficits. Pt ambulated 21 ft with RW with min guard assist, frequent verbal cuing for hip precautions and safety. Pt positioned in recliner with pillow putting RLE in slight abduction to maintain precautions at rest. Pt able to recall precautions with mild prompting from PT this session. Pt educated on ankle pumps (20/hour) to perform this afternoon/evening to increase circulation, to pt's tolerance and limited by pain. PT to progress mobility as tolerated, and will continue to follow acutely.        Follow Up Recommendations Follow surgeon's recommendation for DC plan and follow-up therapies;Supervision for mobility/OOB(HHPT)    Equipment Recommendations  None recommended by PT    Recommendations for Other Services       Precautions / Restrictions Precautions Precautions: Fall;Posterior Hip Precaution Booklet Issued: Yes (comment) Precaution Comments: handout administered, reviewed, demonstrated, and applied to mobility with pt. Rules reviewed: no hip flexion >90*, no hip adduction past midline, no hip IR past neutral, and no crossing legs Restrictions Weight Bearing Restrictions: No Other Position/Activity Restrictions: WBAT      Mobility  Bed Mobility Overal bed mobility: Needs Assistance Bed Mobility: Supine to Sit     Supine to sit: HOB elevated;Min assist     General bed mobility comments:  Min assist for RLE lifting and translation to EOB. Pt encouraged to exit bed to R to maintain hip precautions. Verbal cuing for maintenance of hip precautions when scooting to EOB (hip flexion <90*)  Transfers Overall transfer level: Needs assistance Equipment used: Rolling walker (2 wheeled) Transfers: Sit to/from Stand Sit to Stand: Min guard;From elevated surface         General transfer comment: Min guard for safety. Pt encouraged to power up with UEs and LLE while placing RLE slightly in front to encourage maintenance of hip precautions.   Ambulation/Gait Ambulation/Gait assistance: Min guard;+2 safety/equipment(chair follow) Gait Distance (Feet): 70 Feet Assistive device: Rolling walker (2 wheeled) Gait Pattern/deviations: Step-to pattern;Decreased stance time - right;Decreased weight shift to right;Antalgic;Trunk flexed Gait velocity: decr    General Gait Details: Min guard for safety. Verbal cuing for placement in RW, turning towards nonoperative side vs nonoperative side to maintain IR hip precaution, sequencing. Pt with reports of mild nausea at end of ambulation, resolved.  Stairs            Wheelchair Mobility    Modified Rankin (Stroke Patients Only)       Balance Overall balance assessment: Mild deficits observed, not formally tested                                           Pertinent Vitals/Pain Pain Assessment: 0-10 Pain Score: 3  Pain Location: R hip  Pain Descriptors / Indicators: Sore Pain Intervention(s): Limited activity within patient's tolerance;Repositioned;Ice applied;Monitored during session;Premedicated before session    Home Living Family/patient expects to be  discharged to:: Private residence Living Arrangements: Spouse/significant other Available Help at Discharge: Family;Available 24 hours/day Type of Home: House Home Access: Stairs to enter Entrance Stairs-Rails: Left Entrance Stairs-Number of Steps: 3 Home  Layout: One level Home Equipment: Walker - 2 wheels;Cane - single point;Toilet riser;Shower seat      Prior Function Level of Independence: Independent               Hand Dominance   Dominant Hand: Right    Extremity/Trunk Assessment   Upper Extremity Assessment Upper Extremity Assessment: Overall WFL for tasks assessed    Lower Extremity Assessment Lower Extremity Assessment: Overall WFL for tasks assessed;RLE deficits/detail RLE Deficits / Details: suspected R hip weakness; able to perform quad set, assisted SLR during bed mobility, ankle pumps, small ROM heel slide RLE Sensation: WNL    Cervical / Trunk Assessment Cervical / Trunk Assessment: Normal  Communication   Communication: No difficulties  Cognition Arousal/Alertness: Awake/alert Behavior During Therapy: WFL for tasks assessed/performed Overall Cognitive Status: Within Functional Limits for tasks assessed                                        General Comments      Exercises     Assessment/Plan    PT Assessment Patient needs continued PT services  PT Problem List Decreased strength;Pain;Decreased activity tolerance;Decreased knowledge of use of DME;Decreased balance;Decreased mobility;Decreased knowledge of precautions;Decreased safety awareness       PT Treatment Interventions DME instruction;Therapeutic activities;Gait training;Therapeutic exercise;Patient/family education;Stair training;Balance training;Functional mobility training    PT Goals (Current goals can be found in the Care Plan section)  Acute Rehab PT Goals Patient Stated Goal: none stated PT Goal Formulation: With patient Time For Goal Achievement: 02/20/18 Potential to Achieve Goals: Good    Frequency 7X/week   Barriers to discharge        Co-evaluation               AM-PAC PT "6 Clicks" Mobility  Outcome Measure Help needed turning from your back to your side while in a flat bed without using  bedrails?: A Little Help needed moving from lying on your back to sitting on the side of a flat bed without using bedrails?: A Little Help needed moving to and from a bed to a chair (including a wheelchair)?: A Little Help needed standing up from a chair using your arms (e.g., wheelchair or bedside chair)?: A Little Help needed to walk in hospital room?: A Little Help needed climbing 3-5 steps with a railing? : A Little 6 Click Score: 18    End of Session Equipment Utilized During Treatment: Gait belt Activity Tolerance: Patient tolerated treatment well Patient left: in chair;with chair alarm set;with call bell/phone within reach;with family/visitor present;with SCD's reapplied Nurse Communication: Mobility status PT Visit Diagnosis: Other abnormalities of gait and mobility (R26.89);Difficulty in walking, not elsewhere classified (R26.2)    Time: 9983-3825 PT Time Calculation (min) (ACUTE ONLY): 22 min   Charges:   PT Evaluation $PT Eval Low Complexity: 1 Low         Anoop Hemmer Conception Chancy, PT Acute Rehabilitation Services Pager [phone]  Office [phone]  Lanaiya Lantry D Elonda Husky 02/13/2018, 7:07 PM

## 2018-02-13 NOTE — H&P (Signed)
PREOPERATIVE H&P  Chief Complaint: right hip pain  HPI: Laura Wright is a 68 y.o. female who presents for preoperative history and physical with a diagnosis of right hip oa. Symptoms are rated as moderate to severe, and have been worsening.  This is significantly impairing activities of daily living.  She has elected for surgical management.   She has failed injections, activity modification, anti-inflammatories, and assistive devices.  Preoperative X-rays demonstrate end stage degenerative changes with osteophyte formation, loss of joint space, subchondral sclerosis.   Past Medical History:  Diagnosis Date  . Anxiety   . Complex tear of medial meniscus of right knee as current injury 12/30/2011  . Dental crowns present    x 2  . Family history of adverse reaction to anesthesia    daughter has PONV  . Headache(784.0)    due to trigeminal neuralgia and trauma of surgery  . Herpes simplex type 1 infection   . Hypertension    dx'ed in last six months  . Knee dislocation 12/2011   right  . Localized osteoarthritis of right knee 12/30/2011  . Trigeminal neuralgia    Past Surgical History:  Procedure Laterality Date  . ABDOMINAL HYSTERECTOMY  1986   partial  . COLONOSCOPY    . cyst removed right hand     ganglion x 2  . KNEE ARTHROSCOPY WITH MEDIAL MENISECTOMY  12/30/2011   Procedure: KNEE ARTHROSCOPY WITH MEDIAL MENISECTOMY;  Surgeon: Johnny Bridge, MD;  Location: [place];  Service: Orthopedics;  Laterality: Right;  RIGHT KNEE SCOPE Partial MEDIAL MENISCECTOMY  . PARTIAL KNEE ARTHROPLASTY Right 01/01/2013   Procedure: UNICOMPARTMENTAL KNEE;  Surgeon: Johnny Bridge, MD;  Location: [place];  Service: Orthopedics;  Laterality: Right;  . TRIGEMINAL NERVE DECOMPRESSION  04/2004  . TUBAL LIGATION     Social History   Socioeconomic History  . Marital status: Married    Spouse name: Not on file  . Number of children: Not on file  . Years of education: Not on  file  . Highest education level: Not on file  Occupational History  . Not on file  Social Needs  . Financial resource strain: Not on file  . Food insecurity:    Worry: Not on file    Inability: Not on file  . Transportation needs:    Medical: Not on file    Non-medical: Not on file  Tobacco Use  . Smoking status: Never Smoker  . Smokeless tobacco: Never Used  Substance and Sexual Activity  . Alcohol use: No  . Drug use: No  . Sexual activity: Not Currently  Lifestyle  . Physical activity:    Days per week: Not on file    Minutes per session: Not on file  . Stress: Not on file  Relationships  . Social connections:    Talks on phone: Not on file    Gets together: Not on file    Attends religious service: Not on file    Active member of club or organization: Not on file    Attends meetings of clubs or organizations: Not on file    Relationship status: Not on file  Other Topics Concern  . Not on file  Social History Narrative  . Not on file   Family History  Problem Relation Age of Onset  . Anesthesia problems Daughter        post-op N/V   Allergies  Allergen Reactions  . Macrodantin [Nitrofurantoin Macrocrystal] Itching and Rash  Prior to Admission medications   Medication Sig Start Date End Date Taking? Authorizing Provider  Calcium-Magnesium-Vitamin D (CALCIUM 1200+D3 PO) Take 1 tablet by mouth daily.   Yes [provider]  Estradiol (ELESTRIN) 0.52 MG/0.87 GM (0.06%) GEL Apply 1 application topically daily. Applied to arm   Yes [provider]  glucosamine-chondroitin 500-400 MG tablet Take 1 tablet by mouth daily.   Yes [provider]  LORazepam (ATIVAN) 0.5 MG tablet Take 0.5 mg by mouth daily as needed for anxiety.   Yes [provider]  losartan-hydrochlorothiazide (HYZAAR) 50-12.5 MG tablet Take 1 tablet by mouth daily.   Yes [provider]  Multiple Vitamin (MULTIVITAMIN) tablet Take 1 tablet by mouth daily.    Yes [provider]  traMADol (ULTRAM-ER) 100 MG 24 hr tablet Take 100 mg by mouth daily.   Yes [provider]  valACYclovir (VALTREX) 1000 MG tablet Take 500 mg by mouth daily.   Yes [provider]  enoxaparin (LOVENOX) 30 MG/0.3ML injection Inject 0.3 mLs (30 mg total) into the skin every 12 (twelve) hours. Patient not taking: Reported on 01/24/2018 01/01/13   Marchia Bond, MD  meclizine (ANTIVERT) 25 MG tablet Take 1 tablet (25 mg total) by mouth 3 (three) times daily as needed for dizziness. 11/09/15   Hinda Kehr, MD     Positive ROS: All other systems have been reviewed and were otherwise negative with the exception of those mentioned in the HPI and as above.  Physical Exam: General: Alert, no acute distress Cardiovascular: No pedal edema Respiratory: No cyanosis, no use of accessory musculature GI: No organomegaly, abdomen is soft and non-tender Skin: No lesions in the area of chief complaint Neurologic: Sensation intact distally Psychiatric: Patient is competent for consent with normal mood and affect Lymphatic: No axillary or cervical lymphadenopathy  MUSCULOSKELETAL: right hip with painful arc of motion, 0 IR  Assessment: Right hip oa   Plan: Plan for Procedure(s): TOTAL HIP ARTHROPLASTY  The risks benefits and alternatives were discussed with the patient including but not limited to the risks of nonoperative treatment, versus surgical intervention including infection, bleeding, nerve injury,  blood clots, cardiopulmonary complications, morbidity, mortality, among others, and they were willing to proceed.   Anticipated LOS equal to or greater than 2 midnights due to - Age 28 and older with one or more of the following:  - Obesity  - Expected need for hospital services (PT, OT, Nursing) required for safe  discharge  - Anticipated need for postoperative skilled nursing care or inpatient rehab  - Active co-morbidities: None OR   -  Unanticipated findings during/Post Surgery: None  - Patient is a high risk of re-admission due to: None     Johnny Bridge, MD Cell [phone]   02/13/2018 7:25 AM

## 2018-02-13 NOTE — Transfer of Care (Signed)
Immediate Anesthesia Transfer of Care Note  Patient: Laura Wright  Procedure(s) Performed: TOTAL HIP ARTHROPLASTY (Right Hip)  Patient Location: PACU  Anesthesia Type:MAC and Spinal  Level of Consciousness: awake, alert , oriented and pateint uncooperative  Airway & Oxygen Therapy: Patient Spontanous Breathing and Patient connected to face mask oxygen  Post-op Assessment: Report given to RN and Post -op Vital signs reviewed and stable  Post vital signs: Reviewed and stable  Last Vitals:  Vitals Value Taken Time  BP 109/67 02/13/2018  9:53 AM  Temp    Pulse 76 02/13/2018  9:56 AM  Resp 9 02/13/2018  9:56 AM  SpO2 99 % 02/13/2018  9:56 AM  Vitals shown include unvalidated device data.  Last Pain:  Vitals:   02/13/18 0624  TempSrc:   PainSc: 5       Patients Stated Pain Goal: 5 (84/53/64 6803)  Complications: No apparent anesthesia complications

## 2018-02-13 NOTE — Anesthesia Postprocedure Evaluation (Signed)
Anesthesia Post Note  Patient: Laura Wright  Procedure(s) Performed: TOTAL HIP ARTHROPLASTY (Right Hip)     Patient location during evaluation: PACU Anesthesia Type: Spinal Level of consciousness: oriented and awake and alert Pain management: pain level controlled Vital Signs Assessment: post-procedure vital signs reviewed and stable Respiratory status: spontaneous breathing, respiratory function stable and patient connected to nasal cannula oxygen Cardiovascular status: blood pressure returned to baseline and stable Postop Assessment: no headache, no backache and no apparent nausea or vomiting Anesthetic complications: no    Last Vitals:  Vitals:   02/13/18 1100 02/13/18 1200  BP: (!) 125/92 139/82  Pulse: 80 78  Resp: 15 14  Temp: 36.5 C 36.8 C  SpO2: 100% 96%    Last Pain:  Vitals:   02/13/18 1100  TempSrc:   PainSc: 3                  Shelisa Fern DAVID

## 2018-02-13 NOTE — Anesthesia Procedure Notes (Addendum)
Spinal  Patient location during procedure: OR Start time: 02/13/2018 7:35 AM End time: 02/13/2018 7:37 AM Staffing Anesthesiologist: Lillia Abed, MD Performed: anesthesiologist  Preanesthetic Checklist Completed: patient identified, surgical consent, pre-op evaluation, timeout performed, IV checked, risks and benefits discussed and monitors and equipment checked Spinal Block Patient position: sitting Prep: DuraPrep Patient monitoring: blood pressure, continuous pulse ox, cardiac monitor and heart rate Approach: right paramedian Location: L3-4 Injection technique: single-shot Needle Needle type: Pencan  Needle gauge: 24 G Needle length: 9 cm Needle insertion depth: 5 cm

## 2018-02-13 NOTE — Anesthesia Preprocedure Evaluation (Signed)
Anesthesia Evaluation  Patient identified by MRN, date of birth, ID band Patient awake    Reviewed: Allergy & Precautions, NPO status , Patient's Chart, lab work & pertinent test results  Airway Mallampati: I  TM Distance: >3 FB Neck ROM: Full    Dental   Pulmonary    Pulmonary exam normal        Cardiovascular hypertension, Pt. on medications Normal cardiovascular exam     Neuro/Psych Anxiety    GI/Hepatic   Endo/Other    Renal/GU      Musculoskeletal   Abdominal   Peds  Hematology   Anesthesia Other Findings   Reproductive/Obstetrics                             Anesthesia Physical Anesthesia Plan  ASA: II  Anesthesia Plan: Spinal   Post-op Pain Management:    Induction: Intravenous  PONV Risk Score and Plan: 2  Airway Management Planned: Simple Face Mask  Additional Equipment:   Intra-op Plan:   Post-operative Plan:   Informed Consent: I have reviewed the patients History and Physical, chart, labs and discussed the procedure including the risks, benefits and alternatives for the proposed anesthesia with the patient or authorized representative who has indicated his/her understanding and acceptance.       Plan Discussed with: CRNA and Surgeon  Anesthesia Plan Comments:         Anesthesia Quick Evaluation

## 2018-02-13 NOTE — Op Note (Signed)
02/13/2018  9:47 AM  PATIENT:  Laura Wright   MRN: [id number]  PRE-OPERATIVE DIAGNOSIS: Right hip primary localized osteoarthritis   POST-OPERATIVE DIAGNOSIS:  same  PROCEDURE:  Procedure(s): TOTAL HIP ARTHROPLASTY  PREOPERATIVE INDICATIONS:    Laura Wright is an 68 y.o. female who has a diagnosis of right hip primary localized osteoarthritis and elected for surgical management after failing conservative treatment.  The risks benefits and alternatives were discussed with the patient including but not limited to the risks of nonoperative treatment, versus surgical intervention including infection, bleeding, nerve injury, periprosthetic fracture, the need for revision surgery, dislocation, leg length discrepancy, blood clots, cardiopulmonary complications, morbidity, mortality, among others, and they were willing to proceed.     OPERATIVE REPORT     SURGEON:  Marchia Bond, MD    ASSISTANT:  Joya Gaskins, OPA-C  (Present throughout the entire procedure,  necessary for completion of procedure in a timely manner, assisting with retraction, instrumentation, and closure)     ANESTHESIA: Spinal with intraoperative injection of Marcaine and Toradol  ESTIMATED BLOOD LOSS: 915 mL    COMPLICATIONS:  None.     UNIQUE ASPECTS OF THE CASE: The acetabulum was very sclerotic.  I ended up reaming lined aligned in order to get a stick.  I felt like I was a little bit flat on the cup, but had excellent fit and did not want to adjust the cup after I had appropriate press-fit.  I was matching the both the front and the back.  The hip had extremely good stability throughout functional range of motion after all the implants were in.  The femur was between a 3 and a 4, but the 3 was a little bit loose, therefore I ended up a little bit proud, but had excellent leg length respiration.  COMPONENTS:  [employer] fit femur size 4 with a 05+6.9 mm metallic head ball and a Gription Acetabular  shell size 52, with a single cancellous screw for backup fixation, with an apex hole eliminator and a +4 neutral polyethylene liner.    PROCEDURE IN DETAIL:   The patient was met in the holding area and  identified.  The appropriate hip was identified and marked at the operative site.  The patient was then transported to the OR  and  placed under spinal anesthesia.  At that point, the patient was  placed in the lateral decubitus position with the operative side up and  secured to the operating room table and all bony prominences padded.     The operative lower extremity was prepped from the iliac crest to the distal leg.  Sterile draping was performed.  Time out was performed prior to incision.      A routine posterolateral approach was utilized via sharp dissection  carried down to the subcutaneous tissue.  Gross bleeders were Bovie coagulated.  The iliotibial band was identified and incised along the length of the skin incision.  Self-retaining retractors were  inserted.  With the hip internally rotated, the short external rotators  were identified. The piriformis and capsule was tagged with FiberWire, and the hip capsule released in a T-type fashion.  The femoral neck was exposed, and I resected the femoral neck using the appropriate jig. This was performed at approximately a thumb's breadth above the lesser trochanter.    I then exposed the deep acetabulum, cleared out any tissue including the ligamentum teres.  A wing retractor was placed.  After adequate visualization, I excised  the labrum, and then sequentially reamed.  I placed the trial acetabulum, which seated nicely, and then impacted the real cup into place.  Appropriate version and inclination was confirmed clinically matching their bony anatomy, and also with the use of the jig.  I placed a cancellous screw to augment fixation.  A trial polyethylene liner was placed and the wing retractor removed.    I then prepared the proximal femur  using the cookie-cutter, the lateralizing reamer, and then sequentially reamed and broached.  A trial broach, neck, and head was utilized, and I reduced the hip and it was found to have excellent stability with functional range of motion. The trial components were then removed, and the real polyethylene liner was placed.  I then impacted the real femoral prosthesis into place into the appropriate version, slightly anteverted to the normal anatomy, and I impacted the real head ball into place. The hip was then reduced and taken through functional range of motion and found to have excellent stability. Leg lengths were restored.  I then used a 2 mm drill bits to pass the FiberWire suture from the capsule and piriformis through the greater trochanter, and secured this. Excellent posterior capsular repair was achieved. I also closed the T in the capsule.  I then irrigated the hip copiously again with pulse lavage, and repaired the fascia with Vicryl, followed by Vicryl for the subcutaneous tissue, Monocryl for the skin, Steri-Strips and sterile gauze. The wounds were injected. The patient was then awakened and returned to PACU in stable and satisfactory condition. There were no complications.  Marchia Bond, MD Orthopedic Surgeon [phone]   02/13/2018 9:47 AM

## 2018-02-13 NOTE — Discharge Instructions (Signed)

## 2018-02-14 LAB — CBC
HCT: 34.5 % — ABNORMAL LOW (ref 36.0–46.0)
Hemoglobin: 10.6 g/dL — ABNORMAL LOW (ref 12.0–15.0)
MCH: 27.3 pg (ref 26.0–34.0)
MCHC: 30.7 g/dL (ref 30.0–36.0)
MCV: 88.9 fL (ref 80.0–100.0)
Platelets: 264 10*3/uL (ref 150–400)
RBC: 3.88 MIL/uL (ref 3.87–5.11)
RDW: 13.5 % (ref 11.5–15.5)
WBC: 13.7 10*3/uL — ABNORMAL HIGH (ref 4.0–10.5)
nRBC: 0 % (ref 0.0–0.2)

## 2018-02-14 LAB — BASIC METABOLIC PANEL
Anion gap: 9 (ref 5–15)
BUN: 10 mg/dL (ref 8–23)
CO2: 25 mmol/L (ref 22–32)
Calcium: 8.3 mg/dL — ABNORMAL LOW (ref 8.9–10.3)
Chloride: 100 mmol/L (ref 98–111)
Creatinine, Ser: 0.83 mg/dL (ref 0.44–1.00)
GFR calc Af Amer: >60 mL/min (ref >60)
Glucose, Bld: 161 mg/dL — ABNORMAL HIGH (ref 70–99)
POTASSIUM: 3.8 mmol/L (ref 3.5–5.1)
Sodium: 134 mmol/L — ABNORMAL LOW (ref 135–145)

## 2018-02-14 NOTE — Evaluation (Signed)
Occupational Therapy Evaluation Patient Details Name: Laura Wright MRN: [id number] DOB: [birth year] Today's Date: 02/14/2018    History of Present Illness 68 yo female s/p R THA posterior approach on 02/13/18. PMH includes anxiety, headaches, HTN, R knee dislocation with hx of meniscal repair and UKR 2014, trigeminal neuralgia with decompression 2006.   Clinical Impression   Pt was admitted for the above.  All education was completed and pt verbalizes understanding of posterior THPs during adls. She did not want to practice shower transfer, but she verbalizes understanding and handout provided. No further OT needs.    Follow Up Recommendations  Supervision/Assistance - 24 hour no OT follow up   Equipment Recommendations  3 in 1 bedside commode 3:1 delivered   Recommendations for Other Services       Precautions / Restrictions Precautions Precautions: Fall;Posterior Hip Precaution Booklet Issued: Yes (comment) Restrictions Weight Bearing Restrictions: No Other Position/Activity Restrictions: WBAT      Mobility Bed Mobility               General bed mobility comments: oob  Transfers                supervision for sit to stand; cues for LE placement/THPS      Balance                                           ADL either performed or assessed with clinical judgement   ADL Overall ADL's : Needs assistance/impaired Eating/Feeding: Independent   Grooming: Supervision/safety;Standing   Upper Body Bathing: Supervision/ safety;Sitting   Lower Body Bathing: Supervison/ safety;Sit to/from stand;With adaptive equipment   Upper Body Dressing : Supervision/safety;Sitting   Lower Body Dressing: Moderate assistance;Maximal assistance;With adaptive equipment;Sit to/from stand(reacher for pants/underwear)   Toilet Transfer: Min guard;Ambulation;RW(chair)   Toileting- Water quality scientist and Hygiene: Min guard;Sit to/from stand          General ADL Comments: pt donned clothing. Cues not to bend past 90 with bra/shirt.  Used reacher for pants and underwear. She can have assistance for this at home and will have help with socks/shoes.  Educated on shower sequence and use of 3:1 in shower stall, having someone wipe legs/pins after use.  Educated to stand during Occupational psychologist     Praxis      Pertinent Vitals/Pain Pain Score: 4  Pain Location: R hip  Pain Descriptors / Indicators: Sore Pain Intervention(s): Limited activity within patient's tolerance;Premedicated before session;Monitored during session;Repositioned     Hand Dominance Right   Extremity/Trunk Assessment Upper Extremity Assessment Upper Extremity Assessment: Overall WFL for tasks assessed           Communication Communication Communication: No difficulties   Cognition Arousal/Alertness: Awake/alert Behavior During Therapy: WFL for tasks assessed/performed Overall Cognitive Status: Within Functional Limits for tasks assessed                                     General Comments       Exercises     Shoulder Instructions      Home Living Family/patient expects to be discharged to:: Private residence Living Arrangements: Spouse/significant other  Bathroom Shower/Tub: Occupational psychologist: Standard         Additional Comments: 3:1 delivered to room. Pt thinks she has a Secondary school teacher at home:  husband can assist as needed       Prior Functioning/Environment Level of Independence: Independent                 OT Problem List:        OT Treatment/Interventions:      OT Goals(Current goals can be found in the care plan section) Acute Rehab OT Goals Patient Stated Goal: return to independence OT Goal Formulation: All assessment and education complete, DC therapy  OT Frequency:     Barriers to D/C:            Co-evaluation               AM-PAC OT "6 Clicks" Daily Activity     Outcome Measure Help from another person eating meals?: None Help from another person taking care of personal grooming?: A Little Help from another person toileting, which includes using toliet, bedpan, or urinal?: A Little Help from another person bathing (including washing, rinsing, drying)?: A Little Help from another person to put on and taking off regular upper body clothing?: A Little Help from another person to put on and taking off regular lower body clothing?: Total 6 Click Score: 17   End of Session    Activity Tolerance: Patient tolerated treatment well Patient left: in chair;with call bell/phone within reach  OT Visit Diagnosis: Pain Pain - Right/Left: Right Pain - part of body: Hip                Time: 1040-1055 OT Time Calculation (min): 15 min Charges:  OT General Charges $OT Visit: 1 Visit OT Evaluation $OT Eval Low Complexity: 1 Low  Lesle Chris, OTR/L Acute Rehabilitation Services [phone] [facility] pager [phone] office 02/14/2018  [place] 02/14/2018, 12:13 PM

## 2018-02-14 NOTE — Discharge Summary (Signed)
Physician Discharge Summary  Patient ID: [name] MRN: [id number] DOB/AGE: Dec 25, 1950 68 y.o.  Admit date: 02/13/2018 Discharge date: 02/14/2018  Admission Diagnoses:  Primary localized osteoarthritis of right hip  Discharge Diagnoses:  Principal Problem:   Primary localized osteoarthritis of right hip Active Problems:   S/P hip replacement, right   Past Medical History:  Diagnosis Date  . Anxiety   . Complex tear of medial meniscus of right knee as current injury 12/30/2011  . Dental crowns present    x 2  . Family history of adverse reaction to anesthesia    daughter has PONV  . Headache(784.0)    due to trigeminal neuralgia and trauma of surgery  . Herpes simplex type 1 infection   . Hypertension    dx'ed in last six months  . Knee dislocation 12/2011   right  . Localized osteoarthritis of right knee 12/30/2011  . Primary localized osteoarthritis of right hip 02/13/2018  . Trigeminal neuralgia     Surgeries: Procedure(s): TOTAL HIP ARTHROPLASTY on 02/13/2018   Consultants (if any):   Discharged Condition: Improved  Hospital Course: Laura Wright is an 68 y.o. female who was admitted 02/13/2018 with a diagnosis of Primary localized osteoarthritis of right hip and went to the operating room on 02/13/2018 and underwent the above named procedures.    She was given perioperative antibiotics:  Anti-infectives (From admission, onward)   Start     Dose/Rate Route Frequency Ordered Stop   02/14/18 1000  valACYclovir (VALTREX) tablet 500 mg     500 mg Oral Daily 02/13/18 1343     02/13/18 1400  ceFAZolin (ANCEF) IVPB 2g/100 mL premix     2 g 200 mL/hr over 30 Minutes Intravenous Every 6 hours 02/13/18 1343 02/13/18 2107   02/13/18 0615  ceFAZolin (ANCEF) IVPB 2g/100 mL premix     2 g 200 mL/hr over 30 Minutes Intravenous On call to O.R. 02/13/18 5885 02/13/18 0749   02/13/18 0277  ceFAZolin (ANCEF) 2-4 GM/100ML-% IVPB    Note to Pharmacy:  Lavon Paganini   :  cabinet override      02/13/18 4128 02/13/18 0739    .  She was given sequential compression devices, early ambulation, and aspirin for DVT prophylaxis.  She benefited maximally from the hospital stay and there were no complications.    Recent vital signs:  Vitals:   02/14/18 0211 02/14/18 0613  BP: 121/72 115/78  Pulse: 92 77  Resp: 16 12  Temp: 98 F (36.7 C) 97.8 F (36.6 C)  SpO2: 99% 100%    Recent laboratory studies:  Lab Results  Component Value Date   HGB 10.6 (L) 02/14/2018   HGB 13.2 02/06/2018   HGB 14.6 11/09/2015   Lab Results  Component Value Date   WBC 13.7 (H) 02/14/2018   PLT 264 02/14/2018   No results found for: INR Lab Results  Component Value Date   NA 134 (L) 02/14/2018   K 3.8 02/14/2018   CL 100 02/14/2018   CO2 25 02/14/2018   BUN 10 02/14/2018   CREATININE 0.83 02/14/2018   GLUCOSE 161 (H) 02/14/2018    Discharge Medications:   Allergies as of 02/14/2018      Reactions   Macrodantin [nitrofurantoin Macrocrystal] Itching, Rash      Medication List    STOP taking these medications   enoxaparin 30 MG/0.3ML injection Commonly known as:  LOVENOX     TAKE these medications   aspirin EC 325 MG  tablet Take 1 tablet (325 mg total) by mouth 2 (two) times daily.   baclofen 10 MG tablet Commonly known as:  LIORESAL Take 1 tablet (10 mg total) by mouth 3 (three) times daily. As needed for muscle spasm   CALCIUM 1200+D3 PO Take 1 tablet by mouth daily.   ELESTRIN 0.52 MG/0.87 GM (0.06%) Gel Generic drug:  Estradiol Apply 1 application topically daily. Applied to arm   glucosamine-chondroitin 500-400 MG tablet Take 1 tablet by mouth daily.   LORazepam 0.5 MG tablet Commonly known as:  ATIVAN Take 0.5 mg by mouth daily as needed for anxiety.   losartan-hydrochlorothiazide 50-12.5 MG tablet Commonly known as:  HYZAAR Take 1 tablet by mouth daily.   meclizine 25 MG tablet Commonly known as:  ANTIVERT Take 1 tablet (25 mg  total) by mouth 3 (three) times daily as needed for dizziness.   multivitamin tablet Take 1 tablet by mouth daily.   ondansetron 4 MG tablet Commonly known as:  ZOFRAN Take 1 tablet (4 mg total) by mouth every 8 (eight) hours as needed for nausea or vomiting.   oxyCODONE 5 MG immediate release tablet Commonly known as:  ROXICODONE Take 1 tablet (5 mg total) by mouth every 4 (four) hours as needed for severe pain.   sennosides-docusate sodium 8.6-50 MG tablet Commonly known as:  SENOKOT-S Take 2 tablets by mouth daily.   traMADol 100 MG 24 hr tablet Commonly known as:  ULTRAM-ER Take 100 mg by mouth daily.   VALTREX 1000 MG tablet Generic drug:  valACYclovir Take 500 mg by mouth daily.       Diagnostic Studies: Dg Pelvis Portable  Result Date: 02/13/2018 CLINICAL DATA:  Status post right total hip arthroplasty. EXAM: PORTABLE PELVIS 1-2 VIEWS COMPARISON:  None. FINDINGS: The acetabular and femoral components appear to be well situated. Expected postoperative changes are seen in the surrounding soft tissues. No fracture or dislocation is noted. Left hip appears normal. IMPRESSION: Status post right total hip arthroplasty. Electronically Signed   By: Marijo Conception, M.D.   On: 02/13/2018 10:28    Disposition:     Follow-up Information    Marchia Bond, MD. Schedule an appointment as soon as possible for a visit in 2 weeks.   Specialty:  Orthopedic Surgery Contact information: [address] [place] [address] [phone]            Signed: Johnny Bridge 02/14/2018, 8:35 AM

## 2018-02-14 NOTE — Care Management Note (Signed)
Case Management Note  Patient Details  Name: Laura Wright MRN: [id number] Date of Birth: 21-Jun-1950  Subjective/Objective:   Discharge planning, spoke with patient at bedside. Prearranged with [facility] for [facility] PT, evaluate and treat.                Action/Plan: Contacted [facility] for referral. They have accepted. Needs a 3n1, contacted [facility] to deliver to room. [phone]    Expected Discharge Date:                  Expected Discharge Plan:  [place]  In-House Referral:  NA  Discharge planning Services  CM Consult  Post Acute Care Choice:  Durable Medical Equipment, Home Health Choice offered to:  Patient  DME Arranged:  3-N-1 DME Agency:  [place]:  PT [place]:  [facility] (formerly [facility])  Status of Service:  Completed, signed off  If discussed at [employer] of Stay Meetings, dates discussed:    Additional Comments:  Guadalupe Maple, RN 02/14/2018, 9:18 AM

## 2018-02-14 NOTE — Progress Notes (Signed)
Physical Therapy Treatment Patient Details Name: Laura Wright MRN: [id number] DOB: 03-25-1950 Today's Date: 02/14/2018    History of Present Illness 68 yo female s/p R THA posterior approach on 02/13/18. PMH includes anxiety, headaches, HTN, R knee dislocation with hx of meniscal repair and UKR 2014, trigeminal neuralgia with decompression 2006.    PT Comments    Pt motivated and progressing well with mobility.  Pt performed therex program with assist, negotiated stairs and reviewed car transfers.   Follow Up Recommendations  Follow surgeon's recommendation for DC plan and follow-up therapies;Supervision for mobility/OOB     Equipment Recommendations  None recommended by PT    Recommendations for Other Services       Precautions / Restrictions Precautions Precautions: Fall;Posterior Hip Precaution Booklet Issued: Yes (comment) Precaution Comments: handout administered, reviewed, demonstrated, and applied to mobility with pt. Rules reviewed: no hip flexion >90*, no hip adduction past midline, no hip IR past neutral, and no crossing legs Restrictions Weight Bearing Restrictions: No Other Position/Activity Restrictions: WBAT    Mobility  Bed Mobility Overal bed mobility: Needs Assistance Bed Mobility: Supine to Sit     Supine to sit: Min assist;Min guard     General bed mobility comments: cues for sequence, use of L LE to self assist and adherence to THP  Transfers Overall transfer level: Needs assistance Equipment used: Rolling walker (2 wheeled) Transfers: Sit to/from Stand Sit to Stand: Supervision         General transfer comment: cues for LE placement/precautions  Ambulation/Gait Ambulation/Gait assistance: Min guard;Supervision Gait Distance (Feet): 150 Feet Assistive device: Rolling walker (2 wheeled) Gait Pattern/deviations: Step-to pattern;Decreased stance time - right;Decreased weight shift to right;Antalgic;Trunk flexed Gait velocity: decr     General Gait Details: Min guard for safety. Verbal cuing for placement in RW, turning towards nonoperative side vs nonoperative side to maintain IR hip precaution, sequencing.    Stairs Stairs: Yes Stairs assistance: Min assist Stair Management: One rail Left;Step to pattern;Forwards;With cane Number of Stairs: 5 General stair comments: cues for sequence and cane/foot placement; written instruction provided   Wheelchair Mobility    Modified Rankin (Stroke Patients Only)       Balance Overall balance assessment: Mild deficits observed, not formally tested                                          Cognition Arousal/Alertness: Awake/alert Behavior During Therapy: WFL for tasks assessed/performed Overall Cognitive Status: Within Functional Limits for tasks assessed                                        Exercises Total Joint Exercises Ankle Circles/Pumps: AROM;Both;20 reps;Supine Quad Sets: AROM;Both;10 reps;Supine Heel Slides: AAROM;Right;20 reps;Supine Hip ABduction/ADduction: AAROM;Right;15 reps;Supine    General Comments        Pertinent Vitals/Pain Pain Assessment: 0-10 Pain Score: 4  Pain Location: R hip  Pain Descriptors / Indicators: Sore Pain Intervention(s): Limited activity within patient's tolerance;Monitored during session;Premedicated before session    Home Living Family/patient expects to be discharged to:: Private residence Living Arrangements: Spouse/significant other             Additional Comments: 3:1 delivered to room. Pt thinks she has a Secondary school teacher at home:  husband can assist as needed  Prior Function Level of Independence: Independent          PT Goals (current goals can now be found in the care plan section) Acute Rehab PT Goals Patient Stated Goal: return to independence PT Goal Formulation: With patient Time For Goal Achievement: 02/20/18 Potential to Achieve Goals: Good Progress towards PT  goals: Progressing toward goals    Frequency    7X/week      PT Plan Current plan remains appropriate    Co-evaluation              AM-PAC PT "6 Clicks" Mobility   Outcome Measure  Help needed turning from your back to your side while in a flat bed without using bedrails?: A Little Help needed moving from lying on your back to sitting on the side of a flat bed without using bedrails?: A Little Help needed moving to and from a bed to a chair (including a wheelchair)?: A Little Help needed standing up from a chair using your arms (e.g., wheelchair or bedside chair)?: A Little Help needed to walk in hospital room?: A Little Help needed climbing 3-5 steps with a railing? : A Little 6 Click Score: 18    End of Session Equipment Utilized During Treatment: Gait belt Activity Tolerance: Patient tolerated treatment well Patient left: in chair;with chair alarm set;with call bell/phone within reach;with family/visitor present;with SCD's reapplied Nurse Communication: Mobility status PT Visit Diagnosis: Other abnormalities of gait and mobility (R26.89);Difficulty in walking, not elsewhere classified (R26.2)     Time: 4540-9811 PT Time Calculation (min) (ACUTE ONLY): 34 min  Charges:  $Gait Training: 8-22 mins $Therapeutic Exercise: 8-22 mins                     [place] Pager [phone] Office [phone]    [facility] 02/14/2018, 12:27 PM

## 2018-02-14 NOTE — Progress Notes (Addendum)
Patient ID: Laura Wright, female   DOB: 1950/02/28, 68 y.o.   MRN: [id number]     Subjective:  Patient reports pain as mild.  Patient in bed and in no acute distress.  Denies any CP or SOB  Objective:   VITALS:   Vitals:   02/13/18 2035 02/13/18 2241 02/14/18 0211 02/14/18 0613  BP:  111/75 121/72 115/78  Pulse: 76 88 92 77  Resp: 16 14 16 12   Temp:  98.1 F (36.7 C) 98 F (36.7 C) 97.8 F (36.6 C)  TempSrc:  Oral Oral Oral  SpO2:  98% 99% 100%  Weight:      Height:        ABD soft Sensation intact distally Dorsiflexion/Plantar flexion intact Incision: no drainage   Lab Results  Component Value Date   WBC 13.7 (H) 02/14/2018   HGB 10.6 (L) 02/14/2018   HCT 34.5 (L) 02/14/2018   MCV 88.9 02/14/2018   PLT 264 02/14/2018   BMET    Component Value Date/Time   NA 134 (L) 02/14/2018 0525   K 3.8 02/14/2018 0525   CL 100 02/14/2018 0525   CO2 25 02/14/2018 0525   GLUCOSE 161 (H) 02/14/2018 0525   BUN 10 02/14/2018 0525   CREATININE 0.83 02/14/2018 0525   CALCIUM 8.3 (L) 02/14/2018 0525   GFRNONAA >60 02/14/2018 0525   GFRAA >60 02/14/2018 0525     Assessment/Plan: 1 Day Post-Op   Principal Problem:   Primary localized osteoarthritis of right hip Active Problems:   S/P hip replacement, right   Advance diet Up with therapy Discharge home with home health WBAT Dry dressing PRN Follow up with Dr Mardelle Matte as scheduled   Lunette Stands 02/14/2018, 7:37 AM   Marchia Bond, MD Cell [phone]  Discussed and agree with above.

## 2018-02-14 NOTE — Plan of Care (Signed)
Plan of care reviewed and discussed with the patient. 

## 2018-02-15 ENCOUNTER — Encounter (HOSPITAL_COMMUNITY): Payer: Self-pay | Admitting: Orthopedic Surgery

## 2018-10-26 ENCOUNTER — Other Ambulatory Visit: Payer: Self-pay

## 2018-10-26 DIAGNOSIS — Z20822 Contact with and (suspected) exposure to covid-19: Secondary | ICD-10-CM

## 2018-10-28 LAB — NOVEL CORONAVIRUS, NAA: SARS-CoV-2, NAA: DETECTED — AB

## 2020-06-19 ENCOUNTER — Other Ambulatory Visit (HOSPITAL_COMMUNITY): Payer: Self-pay | Admitting: Orthopedic Surgery

## 2020-06-19 DIAGNOSIS — B999 Unspecified infectious disease: Secondary | ICD-10-CM

## 2020-06-22 ENCOUNTER — Ambulatory Visit (HOSPITAL_COMMUNITY)
Admission: RE | Admit: 2020-06-22 | Discharge: 2020-06-22 | Disposition: A | Discharge status: Home / Self Care | Payer: Medicare Other | Source: Ambulatory Visit | Attending: Orthopedic Surgery | Admitting: Orthopedic Surgery

## 2020-06-22 ENCOUNTER — Other Ambulatory Visit: Payer: Self-pay

## 2020-06-22 DIAGNOSIS — B999 Unspecified infectious disease: Secondary | ICD-10-CM

## 2020-06-22 DIAGNOSIS — L02416 Cutaneous abscess of left lower limb: Secondary | ICD-10-CM | POA: Diagnosis not present

## 2020-06-22 MED ORDER — HEPARIN SOD (PORK) LOCK FLUSH 100 UNIT/ML IV SOLN
INTRAVENOUS | Status: DC | PRN
  Administered 2020-06-22: 500 [IU] via INTRAVENOUS

## 2020-06-22 MED ORDER — HEPARIN SOD (PORK) LOCK FLUSH 100 UNIT/ML IV SOLN
INTRAVENOUS | Status: AC
  Filled 2020-06-22: qty 5

## 2020-06-22 MED ORDER — LIDOCAINE HCL 1 % IJ SOLN
INTRAMUSCULAR | Status: DC | PRN
  Administered 2020-06-22: 5 mL

## 2020-06-22 MED ORDER — LIDOCAINE HCL 1 % IJ SOLN
INTRAMUSCULAR | Status: AC
  Filled 2020-06-22: qty 20

## 2020-06-22 NOTE — Procedures (Signed)
Interventional Radiology Procedure Note  Procedure: PICC placement  Findings: Please refer to procedural dictation for full description. 38 cm single lumen 5 Fr PICC, right basilic vein.  Complications:  None immediate  Estimated Blood Loss: < 5 mL  Recommendations: PICC ready for immediate use.   Ruthann Cancer, MD

## 2020-07-02 LAB — HM COLONOSCOPY

## 2020-07-14 ENCOUNTER — Ambulatory Visit: Payer: Self-pay | Admitting: Surgery

## 2020-07-20 ENCOUNTER — Other Ambulatory Visit: Payer: Self-pay

## 2020-07-20 ENCOUNTER — Encounter (HOSPITAL_BASED_OUTPATIENT_CLINIC_OR_DEPARTMENT_OTHER): Payer: Self-pay | Admitting: Surgery

## 2020-07-20 NOTE — Progress Notes (Addendum)
Spoke w/ via phone for pre-op interview---pt  Lab needs dos----istat   EKG            Lab results------n/a COVID test -----patient states asymptomatic no test needed Arrive at -------noon NPO after MN NO Solid Food.  Clear liquids from MN until---1100 Med rec completed Medications to take morning of surgery -----Zofran, Lorazapam Diabetic medication -----n/a Patient instructed no nail polish to be worn day of surgery Patient instructed to bring photo id and insurance card day of surgery Patient aware to have Driver (ride ) / husband tomcaregiver    for 24 hours after surgery  Patient Special Instructions -----hold Losartan HTZ morning of surgery.  Pt was just scheduled today for tomorrow and unable to come by and get her drink  Pre-Op special Istructions ----- Patient verbalized understanding of instructions that were given at this phone interview. Patient denies shortness of breath, chest pain, fever, cough at this phone interview.

## 2020-07-21 ENCOUNTER — Encounter (HOSPITAL_BASED_OUTPATIENT_CLINIC_OR_DEPARTMENT_OTHER)
Admission: RE | Disposition: A | Discharge status: Home / Self Care | Payer: Self-pay | Source: Ambulatory Visit | Attending: Surgery

## 2020-07-21 ENCOUNTER — Ambulatory Visit (HOSPITAL_COMMUNITY)
Admission: RE | Admit: 2020-07-21 | Discharge: 2020-07-21 | Disposition: A | Discharge status: Home / Self Care | Payer: Medicare Other | Source: Ambulatory Visit | Attending: Surgery | Admitting: Surgery

## 2020-07-21 ENCOUNTER — Ambulatory Visit (HOSPITAL_BASED_OUTPATIENT_CLINIC_OR_DEPARTMENT_OTHER): Payer: Medicare Other | Admitting: Anesthesiology

## 2020-07-21 ENCOUNTER — Other Ambulatory Visit: Payer: Self-pay

## 2020-07-21 ENCOUNTER — Encounter (HOSPITAL_BASED_OUTPATIENT_CLINIC_OR_DEPARTMENT_OTHER): Payer: Self-pay | Admitting: Surgery

## 2020-07-21 DIAGNOSIS — Z79899 Other long term (current) drug therapy: Secondary | ICD-10-CM | POA: Insufficient documentation

## 2020-07-21 DIAGNOSIS — Z96651 Presence of right artificial knee joint: Secondary | ICD-10-CM | POA: Diagnosis not present

## 2020-07-21 DIAGNOSIS — D128 Benign neoplasm of rectum: Secondary | ICD-10-CM | POA: Insufficient documentation

## 2020-07-21 DIAGNOSIS — K648 Other hemorrhoids: Secondary | ICD-10-CM | POA: Diagnosis not present

## 2020-07-21 DIAGNOSIS — Z90710 Acquired absence of both cervix and uterus: Secondary | ICD-10-CM | POA: Insufficient documentation

## 2020-07-21 DIAGNOSIS — K623 Rectal prolapse: Secondary | ICD-10-CM | POA: Diagnosis not present

## 2020-07-21 DIAGNOSIS — I10 Essential (primary) hypertension: Secondary | ICD-10-CM | POA: Diagnosis not present

## 2020-07-21 HISTORY — PX: EVALUATION UNDER ANESTHESIA WITH HEMORRHOIDECTOMY: SHX5624

## 2020-07-21 HISTORY — DX: COVID-19: U07.1

## 2020-07-21 HISTORY — DX: Presence of spectacles and contact lenses: Z97.3

## 2020-07-21 SURGERY — EXAM UNDER ANESTHESIA WITH HEMORRHOIDECTOMY
Anesthesia: General | Site: Rectum

## 2020-07-21 MED ORDER — KETOROLAC TROMETHAMINE 30 MG/ML IJ SOLN
INTRAMUSCULAR | Status: AC
  Filled 2020-07-21: qty 1

## 2020-07-21 MED ORDER — DIBUCAINE (PERIANAL) 1 % EX OINT
TOPICAL_OINTMENT | CUTANEOUS | Status: DC | PRN
  Administered 2020-07-21: 1 via RECTAL

## 2020-07-21 MED ORDER — LIDOCAINE 2% (20 MG/ML) 5 ML SYRINGE
INTRAMUSCULAR | Status: DC | PRN
  Administered 2020-07-21: 60 mg via INTRAVENOUS

## 2020-07-21 MED ORDER — GABAPENTIN 300 MG PO CAPS
ORAL_CAPSULE | ORAL | Status: AC
  Filled 2020-07-21: qty 1

## 2020-07-21 MED ORDER — ROCURONIUM BROMIDE 10 MG/ML (PF) SYRINGE
PREFILLED_SYRINGE | INTRAVENOUS | Status: DC | PRN
  Administered 2020-07-21: 60 mg via INTRAVENOUS

## 2020-07-21 MED ORDER — ACETAMINOPHEN 325 MG PO TABS
ORAL_TABLET | ORAL | Status: AC
  Filled 2020-07-21: qty 2

## 2020-07-21 MED ORDER — ROCURONIUM BROMIDE 10 MG/ML (PF) SYRINGE
PREFILLED_SYRINGE | INTRAVENOUS | Status: AC
  Filled 2020-07-21: qty 10

## 2020-07-21 MED ORDER — OXYCODONE HCL 5 MG PO TABS
ORAL_TABLET | ORAL | Status: AC
  Filled 2020-07-21: qty 1

## 2020-07-21 MED ORDER — FENTANYL CITRATE (PF) 100 MCG/2ML IJ SOLN
INTRAMUSCULAR | Status: AC
  Filled 2020-07-21: qty 2

## 2020-07-21 MED ORDER — GABAPENTIN 300 MG PO CAPS
300.0000 mg | ORAL_CAPSULE | ORAL | Status: AC
  Administered 2020-07-21: 300 mg via ORAL

## 2020-07-21 MED ORDER — CHLORHEXIDINE GLUCONATE CLOTH 2 % EX PADS: 6.0000 | MEDICATED_PAD | Freq: Once | CUTANEOUS | Status: DC

## 2020-07-21 MED ORDER — BELLADONNA ALKALOIDS-OPIUM 16.2-60 MG RE SUPP
1.0000 | RECTAL | Status: AC
  Administered 2020-07-21: 1 via RECTAL

## 2020-07-21 MED ORDER — EPHEDRINE SULFATE-NACL 50-0.9 MG/10ML-% IV SOSY
PREFILLED_SYRINGE | INTRAVENOUS | Status: DC | PRN
  Administered 2020-07-21: 15 mg via INTRAVENOUS

## 2020-07-21 MED ORDER — ACETAMINOPHEN 500 MG PO TABS
ORAL_TABLET | ORAL | Status: AC
  Filled 2020-07-21: qty 2

## 2020-07-21 MED ORDER — CEFTRIAXONE SODIUM 2 G IJ SOLR
INTRAMUSCULAR | Status: AC
  Filled 2020-07-21: qty 20

## 2020-07-21 MED ORDER — SODIUM CHLORIDE 0.9% FLUSH: 3.0000 mL | INTRAVENOUS | Status: DC | PRN

## 2020-07-21 MED ORDER — OXYCODONE HCL 5 MG PO TABS: 5.0000 mg | ORAL_TABLET | Freq: Four times a day (QID) | ORAL | 0 refills | Status: DC | PRN

## 2020-07-21 MED ORDER — ACETAMINOPHEN 500 MG PO TABS
1000.0000 mg | ORAL_TABLET | ORAL | Status: AC
  Administered 2020-07-21: 1000 mg via ORAL

## 2020-07-21 MED ORDER — SUGAMMADEX SODIUM 200 MG/2ML IV SOLN
INTRAVENOUS | Status: DC | PRN
  Administered 2020-07-21: 180 mg via INTRAVENOUS

## 2020-07-21 MED ORDER — PROPOFOL 10 MG/ML IV BOLUS
INTRAVENOUS | Status: DC | PRN
  Administered 2020-07-21: 150 mg via INTRAVENOUS

## 2020-07-21 MED ORDER — SODIUM CHLORIDE 0.9% FLUSH: 3.0000 mL | Freq: Two times a day (BID) | INTRAVENOUS | Status: DC

## 2020-07-21 MED ORDER — BUPIVACAINE LIPOSOME 1.3 % IJ SUSP: 20.0000 mL | Freq: Once | INTRAMUSCULAR | Status: DC

## 2020-07-21 MED ORDER — SODIUM CHLORIDE 0.9 % IV SOLN
2.0000 g | INTRAVENOUS | Status: AC
  Administered 2020-07-21: 2 g via INTRAVENOUS

## 2020-07-21 MED ORDER — 0.9 % SODIUM CHLORIDE (POUR BTL) OPTIME
TOPICAL | Status: DC | PRN
  Administered 2020-07-21: 500 mL

## 2020-07-21 MED ORDER — DEXAMETHASONE SODIUM PHOSPHATE 10 MG/ML IJ SOLN
INTRAMUSCULAR | Status: AC
  Filled 2020-07-21: qty 1

## 2020-07-21 MED ORDER — OXYCODONE HCL 5 MG/5ML PO SOLN: 5.0000 mg | Freq: Once | ORAL | Status: AC | PRN

## 2020-07-21 MED ORDER — ACETAMINOPHEN 325 MG PO TABS
650.0000 mg | ORAL_TABLET | Freq: Four times a day (QID) | ORAL | Status: DC | PRN
Start: 2020-07-21 — End: 2020-07-22
  Administered 2020-07-21: 650 mg via ORAL

## 2020-07-21 MED ORDER — SODIUM CHLORIDE 0.9 % IV SOLN: INTRAVENOUS | Status: DC

## 2020-07-21 MED ORDER — SODIUM CHLORIDE 0.9 % IV SOLN: 250.0000 mL | INTRAVENOUS | Status: DC | PRN

## 2020-07-21 MED ORDER — KETOROLAC TROMETHAMINE 30 MG/ML IJ SOLN
INTRAMUSCULAR | Status: DC | PRN
  Administered 2020-07-21: 30 mg via INTRAVENOUS

## 2020-07-21 MED ORDER — ENSURE PRE-SURGERY PO LIQD: 296.0000 mL | Freq: Once | ORAL | Status: DC

## 2020-07-21 MED ORDER — EPHEDRINE 5 MG/ML INJ
INTRAVENOUS | Status: AC
  Filled 2020-07-21: qty 5

## 2020-07-21 MED ORDER — CHLORHEXIDINE GLUCONATE CLOTH 2 % EX PADS
6.0000 | MEDICATED_PAD | Freq: Once | CUTANEOUS | Status: DC
Start: 2020-07-21 — End: 2020-07-22

## 2020-07-21 MED ORDER — ONDANSETRON HCL 4 MG/2ML IJ SOLN
INTRAMUSCULAR | Status: AC
  Filled 2020-07-21: qty 2

## 2020-07-21 MED ORDER — OXYCODONE HCL 5 MG PO TABS
5.0000 mg | ORAL_TABLET | Freq: Once | ORAL | Status: AC | PRN
  Administered 2020-07-21: 5 mg via ORAL

## 2020-07-21 MED ORDER — PHENYLEPHRINE 40 MCG/ML (10ML) SYRINGE FOR IV PUSH (FOR BLOOD PRESSURE SUPPORT)
PREFILLED_SYRINGE | INTRAVENOUS | Status: DC | PRN
  Administered 2020-07-21 (×2): 80 ug via INTRAVENOUS

## 2020-07-21 MED ORDER — ACETAMINOPHEN 325 MG PO TABS: 650.0000 mg | ORAL_TABLET | Freq: Four times a day (QID) | ORAL | Status: DC | PRN

## 2020-07-21 MED ORDER — PROPOFOL 10 MG/ML IV BOLUS
INTRAVENOUS | Status: AC
  Filled 2020-07-21: qty 20

## 2020-07-21 MED ORDER — FENTANYL CITRATE (PF) 100 MCG/2ML IJ SOLN
INTRAMUSCULAR | Status: DC | PRN
  Administered 2020-07-21: 100 ug via INTRAVENOUS

## 2020-07-21 MED ORDER — DEXAMETHASONE SODIUM PHOSPHATE 10 MG/ML IJ SOLN
INTRAMUSCULAR | Status: DC | PRN
  Administered 2020-07-21: 5 mg via INTRAVENOUS

## 2020-07-21 MED ORDER — MIDAZOLAM HCL 2 MG/2ML IJ SOLN
INTRAMUSCULAR | Status: AC
  Filled 2020-07-21: qty 2

## 2020-07-21 MED ORDER — BELLADONNA ALKALOIDS-OPIUM 16.2-60 MG RE SUPP
RECTAL | Status: AC
  Filled 2020-07-21: qty 1

## 2020-07-21 MED ORDER — SODIUM CHLORIDE 0.9 % IV SOLN
INTRAVENOUS | Status: AC
  Filled 2020-07-21: qty 100

## 2020-07-21 MED ORDER — MIDAZOLAM HCL 2 MG/2ML IJ SOLN
INTRAMUSCULAR | Status: DC | PRN
  Administered 2020-07-21 (×2): 1 mg via INTRAVENOUS

## 2020-07-21 MED ORDER — FENTANYL CITRATE (PF) 100 MCG/2ML IJ SOLN
25.0000 ug | INTRAMUSCULAR | Status: DC | PRN
  Administered 2020-07-21 (×3): 25 ug via INTRAVENOUS

## 2020-07-21 MED ORDER — ONDANSETRON HCL 4 MG/2ML IJ SOLN
INTRAMUSCULAR | Status: DC | PRN
  Administered 2020-07-21: 4 mg via INTRAVENOUS

## 2020-07-21 MED ORDER — SODIUM CHLORIDE 0.9% FLUSH
3.0000 mL | INTRAVENOUS | Status: DC | PRN
Start: 2020-07-21 — End: 2020-07-22

## 2020-07-21 MED ORDER — ONDANSETRON HCL 4 MG/2ML IJ SOLN: 4.0000 mg | Freq: Once | INTRAMUSCULAR | Status: DC | PRN

## 2020-07-21 MED ORDER — BUPIVACAINE-EPINEPHRINE (PF) 0.25% -1:200000 IJ SOLN
INTRAMUSCULAR | Status: DC | PRN
  Administered 2020-07-21: 20 mL

## 2020-07-21 MED ORDER — BUPIVACAINE LIPOSOME 1.3 % IJ SUSP
INTRAMUSCULAR | Status: DC | PRN
  Administered 2020-07-21: 20 mL

## 2020-07-21 MED ORDER — PHENYLEPHRINE 40 MCG/ML (10ML) SYRINGE FOR IV PUSH (FOR BLOOD PRESSURE SUPPORT)
PREFILLED_SYRINGE | INTRAVENOUS | Status: AC
  Filled 2020-07-21: qty 10

## 2020-07-21 MED ORDER — LIDOCAINE HCL (PF) 2 % IJ SOLN
INTRAMUSCULAR | Status: AC
  Filled 2020-07-21: qty 5

## 2020-07-21 MED ORDER — METRONIDAZOLE 500 MG/100ML IV SOLN
INTRAVENOUS | Status: AC
  Filled 2020-07-21: qty 100

## 2020-07-21 MED ORDER — METRONIDAZOLE 500 MG/100ML IV SOLN
500.0000 mg | INTRAVENOUS | Status: DC
  Administered 2020-07-21: 500 mg via INTRAVENOUS

## 2020-07-21 SURGICAL SUPPLY — 39 items
APL SKNCLS STERI-STRIP NONHPOA (GAUZE/BANDAGES/DRESSINGS) ×1
BAG COUNTER SPONGE SURGICOUNT (BAG) IMPLANT
BAG SPNG CNTER NS LX DISP (BAG)
BAG SURGICOUNT SPONGE COUNTING (BAG)
BENZOIN TINCTURE PRP APPL 2/3 (GAUZE/BANDAGES/DRESSINGS) ×3 IMPLANT
BLADE SURG 15 STRL LF DISP TIS (BLADE) IMPLANT
BLADE SURG 15 STRL SS (BLADE)
CNTNR URN SCR LID CUP LEK RST (MISCELLANEOUS) ×1 IMPLANT
CONT SPEC 4OZ STRL OR WHT (MISCELLANEOUS) ×3
COVER SURGICAL LIGHT HANDLE (MISCELLANEOUS) ×3 IMPLANT
DECANTER SPIKE VIAL GLASS SM (MISCELLANEOUS) IMPLANT
DRAPE LAPAROTOMY T 102X78X121 (DRAPES) ×3 IMPLANT
DRSG PAD ABDOMINAL 8X10 ST (GAUZE/BANDAGES/DRESSINGS) ×3 IMPLANT
ELECT REM PT RETURN 15FT ADLT (MISCELLANEOUS) ×3 IMPLANT
GAUZE 4X4 16PLY ~~LOC~~+RFID DBL (SPONGE) ×3 IMPLANT
GAUZE SPONGE 4X4 12PLY STRL (GAUZE/BANDAGES/DRESSINGS) IMPLANT
GAUZE SPONGE 4X4 12PLY STRL LF (GAUZE/BANDAGES/DRESSINGS) ×3 IMPLANT
GLOVE SURG LTX SZ8 (GLOVE) ×3 IMPLANT
GLOVE SURG UNDER LTX SZ8 (GLOVE) ×3 IMPLANT
GOWN STRL REUS W/TWL XL LVL3 (GOWN DISPOSABLE) ×6 IMPLANT
KIT BASIN OR (CUSTOM PROCEDURE TRAY) ×3 IMPLANT
KIT TURNOVER CYSTO (KITS) ×3 IMPLANT
LOOP VESSEL MAXI BLUE (MISCELLANEOUS) IMPLANT
NEEDLE HYPO 22GX1.5 SAFETY (NEEDLE) ×3 IMPLANT
PACK BASIC VI WITH GOWN DISP (CUSTOM PROCEDURE TRAY) ×3 IMPLANT
PANTS MESH DISP LRG (UNDERPADS AND DIAPERS) ×1 IMPLANT
PANTS MESH DISPOSABLE L (UNDERPADS AND DIAPERS) ×2
PENCIL SMOKE EVACUATOR (MISCELLANEOUS) ×3 IMPLANT
SHEARS HARMONIC 9CM CVD (BLADE) IMPLANT
SURGILUBE 2OZ TUBE FLIPTOP (MISCELLANEOUS) ×3 IMPLANT
SUT CHROMIC 2 0 SH (SUTURE) ×3 IMPLANT
SUT CHROMIC 3 0 SH 27 (SUTURE) IMPLANT
SUT VIC AB 2-0 SH 27 (SUTURE)
SUT VIC AB 2-0 SH 27X BRD (SUTURE) IMPLANT
SUT VIC AB 2-0 UR6 27 (SUTURE) ×18 IMPLANT
SYR 20ML LL LF (SYRINGE) ×3 IMPLANT
SYR 3ML LL SCALE MARK (SYRINGE) IMPLANT
TOWEL OR 17X26 10 PK STRL BLUE (TOWEL DISPOSABLE) ×3 IMPLANT
TOWEL OR NON WOVEN STRL DISP B (DISPOSABLE) ×3 IMPLANT

## 2020-07-21 NOTE — Discharge Instructions (Addendum)
ANORECTAL SURGERY:  POST OPERATIVE INSTRUCTIONS  ######################################################################  EAT Start with a pureed / full liquid diet After 24 hours, gradually transition to a high fiber diet.    CONTROL PAIN Control pain so you can tolerate bowel movements,  walk, sleep, tolerate sneezing/coughing, and go up/down stairs.   HAVE A BOWEL MOVEMENT DAILY Keep your bowels regular to avoid problems.   Taking a fiber supplement every day to keep bowels soft.   Try a laxative to override constipation. Use an antidairrheal to slow down diarrhea.   Call if not better after 2 tries  WALK Walk an hour a day.  Control your pain to do that.   CALL IF YOU HAVE PROBLEMS/CONCERNS Call if you are still struggling despite following these instructions. Call if you have concerns not answered by these instructions  ######################################################################    Take your usually prescribed home medications unless otherwise directed.  DIET: Follow a light bland diet & liquids the first 24 hours after arrival home, such as soup, liquids, starches, etc.  Be sure to drink plenty of fluids.  Quickly advance to a usual solid diet within a few days.  Avoid fast food or heavy meals as your are more likely to get nauseated or have irregular bowels.  A low-fat, high-fiber diet for the rest of your life is ideal.  PAIN CONTROL: Pain is best controlled by a usual combination of many methods TOGETHER: Warm baths/soaks or Ice packs Over the counter pain medication Prescription pain medications Topical creams  Expect swelling and discomfort in the anus/rectal area.  Warm water baths (30-60 minutes up to 6 times a day, especially after bowel meovements) will help. Use ice for the first few days to help decrease swelling and bruising, then switch to heat such as warm towels, sitz baths, warm baths, etc to help relax tight/sore spots and speed recovery.   Some people prefer to use ice alone, heat alone, alternating between ice & heat.  Experiment to what works for you.   It is helpful to take an over-the-counter pain medication continuously for the first few weeks.  Choose one of the following that works best for you: Naproxen (Aleve, etc)  Two 220mg  tabs twice a day Ibuprofen (Advil, etc) Three 200mg  tabs four times a day (every meal & bedtime) Acetaminophen (Tylenol, etc) 500-650mg  four times a day (every meal & bedtime) A  prescription for pain medication (such as oxycodone, hydrocodone, etc) should be given to you upon discharge.  Take your pain medication as prescribed.  If you are having problems/concerns with the prescription medicine (does not control pain, nausea, vomiting, rash, itching, etc), please call us [phone] to see if we need to switch you to a different pain medicine that will work better for you and/or control your side effect better. If you need a refill on your pain medication, please contact your pharmacy.  They will contact our office to request authorization. Prescriptions will not be filled after 5 pm or on week-ends.  If can take up to 48 hours for it to be filled & ready so avoid waiting until you are down to thel ast pill. A topical cream (Dibucaine) or a prescription for a cream (such as diltiazem 2% gel) may be given to you.  Many people find relief with topical creams.  Some people find it burns too much.  Experiment.  If it helps, use it.  If it burns, don't using it. You also may receive a prescription for diazepam, and  a muscle relaxant to help you to be able to urinate and defecate more easily.  It is safe to take a few doses with the other medications as long as you are not planning to drive or do anything intense.  Hopefully this can minimize the chance of needing a Foley catheter into your bladder   Use a Sitz Bath 4-8 times a day for relief   Sitz Bath A sitz bath is a warm water bath taken in the sitting  position that covers only the hips and buttocks. It may be used for either healing or hygiene purposes. Sitz baths are also used to relieve pain, itching, or muscle spasms. The water may contain medicine. Moist heat will help you heal and relax.  HOME CARE INSTRUCTIONS  Take 3 to 4 sitz baths a day. Fill the bathtub half full with warm water. Sit in the water and open the drain a little. Turn on the warm water to keep the tub half full. Keep the water running constantly. Soak in the water for 15 to 20 minutes. After the sitz bath, pat the affected area dry first.   KEEP YOUR BOWELS REGULAR The goal is one soft bowel movement a day Avoid getting constipated.  Between the surgery and the pain medications, it is common to experience some constipation.  Increasing fluid intake and taking a fiber supplement (such as Metamucil, Citrucel, FiberCon, MiraLax, etc) 2-3 times a day regularly will usually help prevent this problem from occurring.  A mild laxative (prune juice, Milk of Magnesia, MiraLax, etc) should be taken according to package directions if there are no bowel movements after 48 hours. Watch out for diarrhea.  If you have many loose bowel movements, simplify your diet to bland foods & liquids for a few days.  Stop any stool softeners and decrease your fiber supplement.  Switching to mild anti-diarrheal medications (Kayopectate, Pepto Bismol) can help.  Can try an imodium/loperamide dose.  If this worsens or does not improve, please call us.  Wound Care  Remove your bandages with your first bowel movement, usually the day after surgery.  Let the gauze fall off with the first bowel movement or shower.   Wear an absorbent pad or soft cotton balls in your underwear as needed to catch any drainage and help keep the area  Keep the area clean and dry.  Bathe / shower every day.  Keep the area clean by showering / bathing over the incision / wound.   It is okay to soak an open wound to help wash it.   Consider using a squeeze bottle filled with warm water to gently wash the anal area.  Wet wipes or showers / gentle washing after bowel movements is often less traumatic than regular toilet paper. You will often notice bleeding with bowel movements.  This should slow down by the end of the first week of surgery.  Sitting on an ice pack can help. Expect some drainage.  This should slow down by the end of the first week of surgery, but you will have occasional bleeding or drainage up to a few months after surgery.  Wear an absorbent pad or soft cotton gauze in your underwear until the drainage stops.  ACTIVITIES as tolerated:   You may resume regular (light) daily activities beginning the next day--such as daily self-care, walking, climbing stairs--gradually increasing activities as tolerated.  If you can walk 30 minutes without difficulty, it is safe to try more intense activity such as  jogging, treadmill, bicycling, low-impact aerobics, swimming, etc. Save the most intensive and strenuous activity for last such as sit-ups, heavy lifting, contact sports, etc  Refrain from any heavy lifting or straining until you are off narcotics for pain control.   DO NOT PUSH THROUGH PAIN.  Let pain be your guide: If it hurts to do something, don't do it.  Pain is your body warning you to avoid that activity for another week until the pain goes down. You may drive when you are no longer taking prescription pain medication, you can comfortably sit for long periods of time, and you can safely maneuver your car and apply brakes. You may have sexual intercourse when it is comfortable.  FOLLOW UP in our office Please call [facility] at [phone] to set up an appointment to see your surgeon in the office for a follow-up appointment approximately 2-3 weeks after your surgery. Make sure that you call for this appointment the day you arrive home to ensure a convenient appointment time.  8. IF YOU HAVE DISABILITY OR FAMILY LEAVE  FORMS, BRING THEM TO THE OFFICE FOR PROCESSING.  DO NOT GIVE THEM TO YOUR DOCTOR.        WHEN TO CALL us [phone]: Poor pain control Reactions / problems with new medications (rash/itching, nausea, etc)  Fever over 101.5 F (38.5 C) Inability to urinate Nausea and/or vomiting Worsening swelling or bruising Continued bleeding from incision. Increased pain, redness, or drainage from the incision  The clinic staff is available to answer your questions during regular business hours (8:30am-5pm).  Please don't hesitate to call and ask to speak to one of our nurses for clinical concerns.   A surgeon from [facility] is always on call at the hospitals   If you have a medical emergency, go to the nearest emergency room or call 911.    [facility], [place], [place], [place],   [address] ? MAIN: [phone] ? TOLL FREE: [phone] ? FAX [phone] [email or web address]   Information for Discharge Teaching: EXPAREL (bupivacaine liposome injectable suspension)   Your surgeon or anesthesiologist gave you EXPAREL(bupivacaine) to help control your pain after surgery.  EXPAREL is a local anesthetic that provides pain relief by numbing the tissue around the surgical site. EXPAREL is designed to release pain medication over time and can control pain for up to 72 hours. Depending on how you respond to EXPAREL, you may require less pain medication during your recovery.  Possible side effects: Temporary loss of sensation or ability to move in the area where bupivacaine was injected. Nausea, vomiting, constipation Rarely, numbness and tingling in your mouth or lips, lightheadedness, or anxiety may occur. Call your doctor right away if you think you may be experiencing any of these sensations, or if you have other questions regarding possible side effects.  Follow all other discharge instructions given to you by your  surgeon or nurse. Eat a healthy diet and drink plenty of water or other fluids.  If you return to the hospital for any reason within 96 hours following the administration of EXPAREL, it is important for health care providers to know that you have received this anesthetic. A teal colored band has been placed on your arm with the date, time and amount of EXPAREL you have received in order to alert and inform your health care providers. Please leave this armband in place for the full 96 hours following administration, and then you may remove the  band.  Post Anesthesia Home Care Instructions  Activity: Get plenty of rest for the remainder of the day. A responsible individual must stay with you for 24 hours following the procedure.  For the next 24 hours, DO NOT: -Drive a car -Paediatric nurse -Drink alcoholic beverages -Take any medication unless instructed by your physician -Make any legal decisions or sign important papers.  Meals: Start with liquid foods such as gelatin or soup. Progress to regular foods as tolerated. Avoid greasy, spicy, heavy foods. If nausea and/or vomiting occur, drink only clear liquids until the nausea and/or vomiting subsides. Call your physician if vomiting continues.  Special Instructions/Symptoms: Your throat may feel dry or sore from the anesthesia or the breathing tube placed in your throat during surgery. If this causes discomfort, gargle with warm salt water. The discomfort should disappear within 24 hours.  If you had a scopolamine patch placed behind your ear for the management of post- operative nausea and/or vomiting:  1. The medication in the patch is effective for 72 hours, after which it should be removed.  Wrap patch in a tissue and discard in the trash. Wash hands thoroughly with soap and water. 2. You may remove the patch earlier than 72 hours if you experience unpleasant side effects which may include dry mouth, dizziness or visual disturbances. 3.  Avoid touching the patch. Wash your hands with soap and water after contact with the patch.  Do not take any Tylenol until after 6:30 pm today. Do not take any nonsteroidal anti inflammatories until after 9:45 pm today.

## 2020-07-21 NOTE — Anesthesia Preprocedure Evaluation (Signed)
Anesthesia Evaluation  Patient identified by MRN, date of birth, ID band Patient awake    Reviewed: Allergy & Precautions, NPO status , Patient's Chart, lab work & pertinent test results  Airway Mallampati: II  TM Distance: >3 FB Neck ROM: Full    Dental  (+) Teeth Intact, Dental Advisory Given   Pulmonary neg pulmonary ROS,    Pulmonary exam normal breath sounds clear to auscultation       Cardiovascular hypertension, Pt. on medications Normal cardiovascular exam Rhythm:Regular Rate:Normal     Neuro/Psych  Headaches, PSYCHIATRIC DISORDERS Anxiety  Neuromuscular disease    GI/Hepatic Neg liver ROS, HEMORRHOIDS GRADE 4   Endo/Other  negative endocrine ROS  Renal/GU negative Renal ROS     Musculoskeletal  (+) Arthritis ,   Abdominal   Peds  Hematology negative hematology ROS (+)   Anesthesia Other Findings Day of surgery medications reviewed with the patient.  Reproductive/Obstetrics                             Anesthesia Physical Anesthesia Plan  ASA: 2  Anesthesia Plan: General   Post-op Pain Management:    Induction: Intravenous  PONV Risk Score and Plan: 3 and Midazolam, Dexamethasone and Ondansetron  Airway Management Planned: LMA  Additional Equipment:   Intra-op Plan:   Post-operative Plan: Extubation in OR  Informed Consent: I have reviewed the patients History and Physical, chart, labs and discussed the procedure including the risks, benefits and alternatives for the proposed anesthesia with the patient or authorized representative who has indicated his/her understanding and acceptance.     Dental advisory given  Plan Discussed with: CRNA  Anesthesia Plan Comments:         Anesthesia Quick Evaluation

## 2020-07-21 NOTE — Op Note (Signed)
07/21/2020  3:34 PM  PATIENT:  Laura Wright  70 y.o. 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  See operative record  Delay start of Pharmacological VTE agent (>24hrs) due to surgical blood loss or risk of bleeding:  NO  DRAINS: NONE  SPECIMEN:   Right posterior right posterior prolapsing rectal wall mass.  Probable adenomatous polyp with en bloc right posterior hemorrhoidectomy  Right anterior hemorrhoid  DISPOSITION OF SPECIMEN:  PATHOLOGY  COUNTS:  YES  PLAN OF CARE: Discharge home after PACU  PATIENT DISPOSITION:  PACU - hemodynamically stable.  INDICATION: Pleasant patient with struggles with hemorrhoids.  Underwent endoscopic banding x2 but then developed worsening swelling and prolapse.  Underwent colonoscopy with bleeding.  No major proximal lesion but polyp related prolapsing mass noted.  Biopsy concern for adenomatous tissue.  Chronically prolapse with incontinence.  Frustrating.  Not able to be managed in the office despite an improved bowel regimen.  I recommended examination under anesthesia and surgical treatment:  The anatomy & physiology of the anorectal region was discussed.  The pathophysiology of hemorrhoids and differential diagnosis was discussed.  Natural  history risks without surgery was discussed.   I stressed the importance of a bowel regimen to have daily soft bowel movements to minimize progression of disease.  Interventions such as sclerotherapy & banding were discussed.  The patient's symptoms are not adequately controlled by medicines and other non-operative treatments.  I feel the risks & problems of no surgery outweigh the operative risks; therefore, I recommended surgery to treat the hemorrhoids by ligation, pexy, and possible resection.  Risks such as bleeding, infection, need for further treatment, heart attack, death, and other risks were discussed.   I noted a good likelihood this will help address the problem.  Goals of post-operative recovery were discussed as well.  Possibility that this will not correct all symptoms was explained.  Post-operative pain, bleeding, constipation, urinary difficulties, and other problems after surgery were discussed.  We will work to minimize complications.   Educational handouts further explaining the pathology, treatment options, and bowel regimen were given as well.  Questions were answered.  The patient expresses understanding & wishes to proceed with surgery.  OR FINDINGS: Moderately large pedunculated right posterior rectal polypoid mass.  Suspicious for adenoma.  Just proximal to a great 3/4 right posterior hemorrhoid.  Excision done.  Right anterior grade 3 prolapsing hemorrhoid.  Ligation pexy and hemorrhoidectomy done.  Left lateral grade 2.  No other rectal masses.  No true circumferential procidentia prolapse.  Decreased anal sphincter tone overall.  No fissure.  No fistulas.  No pilonidal disease.  DESCRIPTION:   Informed consent was confirmed. Patient underwent general anesthesia without difficulty. Patient was placed into prone positioning.  The perianal region was prepped and draped in sterile fashion. Surgical time-out confirmed our plan.  I did digital  rectal examination and then  transitioned over to anoscopy to get a sense of the anatomy.  Findings noted above.   I focused on the large prolapsing rectal mass.  It was coming off the right posterior rectum about 3 cm from the anal verge, just proximal to an inflamed enlarged right posterior hemorrhoid as well.  I placed a 2-0 Vicryl suture on a UR 6 needle in a figure-of-eight fashion proximally and ran that kidney running ERAS with locking fashion.  I then excised the right posterior hemorrhoid and large polyp over the sphincters and rectal wall.  Near full-thickness excision on the polyp fluid mass.  This involved about quarter of the rectal wall circumference.  I closed the wound longitudinally with a running 2-0 interrupted Vicryl suture as first and then switched over to horizontal interrupted running suture from left posterior to right posterior to close the defect from the excision.  Did that over a Parks retractor to avoid any narrowing.  Then focused on the rest of the piles.  I proceeded to do hemorrhoidal ligation and pexy.  I used a 2-0 Vicryl suture on a UR-6 needle in a figure-of-eight fashion 6 cm proximal to the anal verge.  I started at the largest hemorrhoid pile remaining, right anterior.  Because of redundant hemorrhoidal tissue too bulky to merely ligate or pexy, I excised the excess internal hemorrhoid piles longitudinally in a fusiform biconcave fashion, sparing the anal canal to avoid narrowing.  I then ran that stitch longitudinally more distally to close the hemorrhoidectomy wound to the anal verge over a large Parks self-retaining anal retractor to avoid narrowing of the anal canal.  I then tied that stitch down to cause a hemorrhoidopexy.   I then did hemorrhoidal ligation and pexy at the other 4 hemorrhoidal columns.  At the completion of this, all 6 anorectal columns were ligated and pexied in the classic hexagonal fashion (right anterior/lateral/posterior, left anterior/lateral/posterior).   I then excised  redundant right anterior internal and external hemorrhoidal tissue that was quite redundant.  I closed this external hemorrhoidectomy wounds with radial interrupted horizontal mattress suture.  Started with 2-0 Vicryl at the anal verge and then 2-0 chromic suture over a large Sawyer anal retractor, leaving the last 5 mm open to allow natural drainage.    I redid anoscopy & examination.  At completion of this, all hemorrhoids had been removed or reduced into the rectum.  There is no more prolapse.  Internal & external anatomy was more more normal.  Hemostasis was good.  Fluffed gauze was on-laid over the perianal region.  BNO suppository placed and strict IV fluid restrictions in the hopes of minimizing urinary retention.  Patient is being extubated go to go to the recovery room.  I had discussed postop care in detail with the patient in the preop holding area.  Instructions for post-operative recovery and prescriptions are written. I discussed operative findings, updated the patient's status, discussed probable steps to recovery, and gave postoperative recommendations to the patient's spouse.  Recommendations were made.  Questions were answered.  He expressed understanding & appreciation.  Adin Hector, M.D., F.A.C.S. Gastrointestinal and Minimally Invasive Surgery [facility] [address], [place], [place] [address] [phone] Main / Paging

## 2020-07-21 NOTE — Transfer of Care (Signed)
Immediate Anesthesia Transfer of Care Note  Patient: Laura Wright  Procedure(s) Performed: Procedure(s) (LRB): EXAM UNDER ANESTHESIA WITH HEMORRHOIDECTOMY WITH LIGATION AND HEMORRHOIDOPEXY; REMOVAL OF PROLAPSING RECTAL TISSUE (N/A)  Patient Location: PACU  Anesthesia Type: General  Level of Consciousness: awake, oriented, sedated and patient cooperative  Airway & Oxygen Therapy: Patient Spontanous Breathing and Patient connected to face mask oxygen  Post-op Assessment: Report given to PACU RN and Post -op Vital signs reviewed and stable  Post vital signs: Reviewed and stable  Complications: No apparent anesthesia complications Last Vitals:  Vitals Value Taken Time  BP 165/81 07/21/20 1545  Temp 36.4 C 07/21/20 1545  Pulse 93 07/21/20 1554  Resp 21 07/21/20 1554  SpO2 96 % 07/21/20 1554  Vitals shown include unvalidated device data.  Last Pain:  Vitals:   07/21/20 1202  TempSrc: Oral  PainSc: 2       Patients Stated Pain Goal: 3 (89/78/47 8412)  Complications: No notable events documented.

## 2020-07-21 NOTE — Progress Notes (Signed)
Per Dr Clyda Greener order, patient was unable to void after multiple attempts a foley was placed and output was 125 ml.  Foley was removed and patient was discharged home.

## 2020-07-21 NOTE — H&P (Signed)
07/21/2020    REFERRING PHYSICIAN: Bobbye Charleston*  Patient Care Team: Duwaine Maxin Rica Mote, DO as PCP - General (Internal Medicine) Johney Maine, Adrian Saran, MD as Consulting Provider (General Surgery) Magod, Glade Stanford, MD (Gastroenterology) Marchia Bond, MD (Orthopedic Surgery)  PROVIDER: Hollace Kinnier, MD  [facility] MRN: [id number] DOB: 01/14/1950 DATE OF ENCOUNTER: 07/14/2020  Subjective   Chief Complaint: rectal prolapse   History of Present Illness: Laura Wright is a 70 y.o. female who is seen today as an office consultation at the request of Dr. Redmond Pulling for evaluation of rectal prolapse .   Pleasant woman who had evidence of some hemorrhoidal mucosal prolapse and underwent consultation by Dr. Marcello Moores with our group earlier this year. She has been in the office a few times. She has had banding's done x2. She has had some rectal bleeding. He had a been recommended numerous times to consider colonoscopy. Apparently that is being worked on through [facility]. Patient is also had some hip and joint discomfort. Saw orthopedic surgery. Concern for a possible thigh abscess. They declined managing it and wished surgery to address the thigh abscess. Patient wished second opinion so comes in to me today to try and address both issues.  Patient notes she is seeing Dr. Mardelle Matte with orthopedic surgery for the deep thigh abscess. She had aspiration. It grew out E. coli. She was placed on IV Rocephin for couple weeks. Then transition to oral doxycycline and currently on Bactrim. Pain and swelling have gone down. She feels like that area is improved.  However she feels like there is a mass popping out of the anus all the time. Now it is chronically out. She cannot reduce it. She is getting leaking and drainage. Having to change the pad many times a day. Leaking stool. Worsening pain. Frustrated and tearful. Her husband is concerned as well. Patient notes she used to be on  Benefiber and it switched to Metamucil to try and help set her bowels out. As stated before she did get a colonoscopy to rule out other possibilities for her bleeding and discomfort. She noticed that it is gotten worse since then. She shows me pictures of the colonoscopy that just talk about rectal prolapse and no more proximal masses for certain mention. I do not have the official report. Were not able to get it. She had a tubal ligation hysterectomy but she has not had any other abdominal surgeries. She can walk at least 1/2-hour before she has to stop. She does not smoke. She is nondiabetic.   No personal nor family history of GI/colon cancer, inflammatory bowel disease, irritable bowel syndrome, allergy such as Celiac Sprue, dietary/dairy problems, colitis, ulcers nor gastritis. No recent sick contacts/gastroenteritis. No travel outside the country. No changes in diet. No dysphagia to solids or liquids. No significant heartburn or reflux. No hematochezia, hematemesis, coffee ground emesis. No evidence of prior gastric/peptic ulceration.  Medical History: Past Medical History:  Diagnosis Date   Anxiety   Hypertension   There is no problem list on file for this patient.  Past Surgical History:  Procedure Laterality Date   HYSTERECTOMY   JOINT REPLACEMENT   KNEE ARTHROSCOPY Right 01/01/2013    Allergies  Allergen Reactions   Macrodantin [Nitrofurantoin Macrocrystal] Itching and Rash   Current Outpatient Medications on File Prior to Visit  Medication Sig Dispense Refill   baclofen (LIORESAL) 10 MG tablet Take 10 mg by mouth 3 (three) times daily   estradioL (ELESTRIN) 0.87 gram/actuation topical  gel Apply topically   glucosamine su 2KCl-chondroit 500-400 mg Tab Take 1 tablet by mouth once daily   LORazepam (ATIVAN) 0.5 MG tablet Take 0.5 mg by mouth once daily as needed   losartan-hydrochlorothiazide (HYZAAR) 50-12.5 mg tablet Take 1 tablet by mouth once daily   meclizine (ANTIVERT) 25  MG tablet Take 25 mg by mouth 3 (three) times daily as needed   multivitamin tablet Take 1 tablet by mouth once daily   sennosides (SENOKOT) 8.6 mg tablet Take 1 tablet by mouth once daily   traMADoL (ULTRAM-ER) 100 MG ER tablet Take 100 mg by mouth once daily   valACYclovir (VALTREX) 1000 MG tablet Take 0.5 tablets by mouth once daily   No current facility-administered medications on file prior to visit.   History reviewed. No pertinent family history.   Social History   Tobacco Use  Smoking Status Never Smoker  Smokeless Tobacco Never Used    Social History   Socioeconomic History   Marital status: Married  Tobacco Use   Smoking status: Never Smoker   Smokeless tobacco: Never Used  Scientific laboratory technician Use: Never used  Substance and Sexual Activity   Alcohol use: Not Currently   Drug use: Never   ############################################################  Review of Systems: A complete review of systems (ROS) was obtained from the patient. I have reviewed this information and discussed as appropriate with the patient. See HPI as well for other pertinent ROS.  Constitutional: No fevers, chills, sweats. Weight stable Eyes: No vision changes, No discharge HENT: No sore throats, nasal drainage Lymph: No neck swelling, No bruising easily Pulmonary: No cough, productive sputum CV: No orthopnea, PND Patient walks 60 minutes for about 2 miles without difficulty. No exertional chest/neck/shoulder/arm pain.  GI: No personal nor family history of GI/colon cancer, inflammatory bowel disease, irritable bowel syndrome, allergy such as Celiac Sprue, dietary/dairy problems, colitis, ulcers nor gastritis. No recent sick contacts/gastroenteritis. No travel outside the country. No changes in diet.  Renal: No UTIs, No hematuria Genital: No drainage, bleeding, masses Musculoskeletal: No severe joint pain. Good ROM major joints Skin: No sores or lesions Heme/Lymph: No easy bleeding. No  swollen lymph nodes  Objective:   Vitals:  07/14/20 1104  BP: (!) 142/82  Pulse: 82  Weight: 80.6 kg (177 lb 9.6 oz)  Height: 163.8 cm (5' 4.5")    Body mass index is 30.01 kg/m.  PHYSICAL EXAM:  Constitutional: Not cachectic. Hygeine adequate. Vitals signs as above.  Eyes: Pupils reactive, normal extraocular movements. Sclera nonicteric Neuro: CN II-XII intact. No major focal sensory defects. No major motor deficits. Lymph: No head/neck/groin lymphadenopathy Psych: No severe agitation. No severe anxiety. Judgment & insight Adequate, Oriented x4, HENT: Normocephalic, Mucus membranes moist. No thrush.  Neck: Supple, No tracheal deviation. No obvious thyromegaly Chest: No pain to chest wall compression. Good respiratory excursion. No audible wheezing CV: Pulses intact. Regular rhythm. No major extremity edema  Abdomen: Flat Hernia: Not present. Diastasis recti: Not present. Soft. Nondistended. Nontender. No hepatomegaly. No splenomegaly  Gen: Inguinal hernia: Not present. Inguinal lymph nodes: without lymphadenopathy.   Rectal: Large prolapsed purple mass very sensitive but can reduce down. Right posterior rectal wall pedunculated. Looks like rectal mucosa but acts like a giant prolapsed hemorrhoid. No thrombosis. There is no abscess. No fissure or fistula. Normal sphincter tone. I feel no enlarged hemorrhoids elsewhere nor other masses or tumors.   Ext: No obvious deformity or contracture. Edema: Not present. No cyanosis Skin: No major subcutaneous  nodules. Warm and dry Musculoskeletal: Severe joint rigidity not present. No obvious clubbing. No digital petechiae.   Labs, Imaging and Diagnostic Testing:  Located in [place]' section of Epic EMR chart  PRIOR NOTES   Tamico Mundo Appointment: 05/26/2020 10:20 AM Location: [facility] Patient #: [id number] DOB: [birth year] Married / Language: Cleophus Molt / Race: White Female  History of Present Illness  Leighton Ruff MD; 7/82/9562 10:42 AM) The patient is a 70 year old female who presents with hemorrhoids. 70 year old female who presented to the office for evaluation of a prolapsing hemorrhoid.  She stated that she has noticed this for several months.  She reports mucous drainage and urgency.  She has rectal bleeding almost on a daily basis.  Last colonoscopy was over 10 years ago.  She reports regular bowel habits and denies any straining for bowel movements.  I recommended that she start a fiber supplement and we gave her a round of hemorrhoid suppositories.  She completed this and then underwent rubber band ligation last month and the month before.  She states her symptoms did not improve.  She continues to have prolapsing with bowel movements and blood on the toilet paper and her pad.  She is waiting to hear back from [facility] to schedule a colonoscopy.  Allergies Emeline Gins, CMA; 05/26/2020 10:12 AM) No Known Drug Allergies   [03/02/2020]: Allergies Reconciled    Medication History Emeline Gins, [place]; 05/26/2020 10:12 AM) Anusol-HC  (25MG  Suppository, 1 (one) Rectal once a da, Taken starting 03/02/2020) Active. Rosuvastatin Calcium  (10MG  Cap Sprinkle, Oral) Active. valACYclovir HCl  (500MG  Tablet, Oral) Active. Elestat  (0.05% Solution, Ophthalmic) Active. Calcium Carbonate  (1250 (500 Ca)MG Tablet Chewable, Oral) Active. Glucosamine  (500MG  Capsule, Oral) Active. Multi-Day  (Oral) Active. Fish Oil  (500MG  Capsule, Oral) Active. LORazepam  (0.5MG  Tablet, Oral) Active. Losartan Potassium-HCTZ  (50-12.5MG  Tablet, Oral) Active. Medications Reconciled   Vitals Emeline Gins CMA; 05/26/2020 10:12 AM) 05/26/2020 10:11 AM Weight: 178.6 lb   Height: 63 in  Body Surface Area: 1.84 m   Body Mass Index: 31.64 kg/m   Temp.: 97.6 F    Pulse: 95 (Regular)     Physical Exam Leighton Ruff MD; 02/02/8655 10:42 AM) General Mental Status - Alert. General Appearance -  Cooperative.  Rectal Anorectal Exam External - skin tag. Internal - normal sphincter tone.  Results Leighton Ruff MD; 8/46/9629 10:45 AM) Procedures  Name Value Date HEMORRHOIDECTOMY, INTERNAL, RUBBER BAND LIGATION (52841) [ Hemorrhoids ] Procedure   Other: The anatomy & physiology of the anorectal region was discussed.  The pathophysiology of hemorrhoids and differential diagnosis was discussed.  Natural history progression  was discussed.   I stressed the importance of a bowel regimen to have daily soft bowel movements to minimize progression of disease.   ..........Marland KitchenMarland KitchenThe patient's symptoms are not adequately controlled.  Therefore, I recommended banding to treat the hemorrhoids.  I went over the technique, risks, benefits, and alternatives.   Goals of post-operative recovery were discussed as well.  Questions were answered.  The patient expressed understanding & wished to proceed...........Marland KitchenMarland KitchenThe patient was positioned in the prone position on the proctology table.  Perianal & rectal examination was done.  Using anoscopy, I ligated the L posterior prolapsing rectal mucosa above the dentate line with banding.  The patient tolerated the procedure well.  Educational handouts further explaining the pathology, treatment options, and bowel regimen were given as well.  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 Despite rubber band ligation of her hemorrhoids. On exam today, she still appears to have some rectal mucosal prolapse in the left posterior position. This was rubber band ligated as well per patient request. In the meantime, she will work on scheduling a colonoscopy for screening purposes. I'll see her back in one month to check on her symptoms. If this does not resolve, we will plan on surgical  resection. Current Plans Follow up with [place] in the office in 1 month.   Call us sooner as needed.  HEMORRHOIDECTOMY, INTERNAL, RUBBER BAND LIGATION ([id number])  ##############################  Gilman Buttner Appointment: 04/27/2020 10:20 AM Location: [facility] Patient #: [id number] DOB: 03-04-1950 Married / Language: Cleophus Molt / Race: White Female  History of Present Illness Leighton Ruff MD; 7/65/4650 10:40 AM) The patient is a 70 year old female who presents with hemorrhoids. 70 year old female who presented to the office for evaluation of a prolapsing hemorrhoid.  She stated that she has noticed this for several months.  She reports mucous drainage and urgency.  She has rectal bleeding almost on a daily basis.  Last colonoscopy was over 10 years ago.  She reports regular bowel habits and denies any straining for bowel movements.  I recommended that she start a fiber supplement and we gave her a round of hemorrhoid suppositories.  She completed this and then underwent rubber band ligation last month.  She states her symptoms did not improve.  She continues to have prolapsing with bowel movements and blood on the toilet paper.  Allergies Jess Barters, CMA; 04/27/2020 10:22 AM) No Known Drug Allergies   [03/02/2020]: Allergies Reconciled    Medication History Jess Barters, CMA; 04/27/2020 10:22 AM) Anusol-HC  (25MG  Suppository, 1 (one) Rectal once a da, Taken starting 03/02/2020) Active. Rosuvastatin Calcium  (10MG  Cap Sprinkle, Oral) Active. valACYclovir HCl  (500MG  Tablet, Oral) Active. Elestat  (0.05% Solution, Ophthalmic) Active. Calcium Carbonate  (1250 (500 Ca)MG Tablet Chewable, Oral) Active. Glucosamine  (500MG  Capsule, Oral) Active. Multi-Day  (Oral) Active. Fish Oil  (500MG  Capsule, Oral) Active. LORazepam  (0.5MG  Tablet, Oral) Active. Medications Reconciled   Physical Exam Leighton Ruff MD; 3/54/6568 10:40 AM) General Mental Status - Alert. General Appearance -  Cooperative.  Rectal Anorectal Exam External - skin tag  (noninflamed) . Internal - generalized tenderness.  Results Leighton Ruff MD; 01/30/5168 10:41 AM) Procedures  Name Value Date HEMORRHOIDECTOMY, INTERNAL, RUBBER BAND LIGATION (01749) [ Hemorrhoids ] Procedure   Other: The anatomy & physiology of the anorectal region was discussed.  The pathophysiology of hemorrhoids and differential diagnosis was discussed.  Natural history progression  was discussed.   I stressed the importance of a bowel regimen to have daily soft bowel movements to minimize progression of disease.   ..........Marland KitchenMarland KitchenThe patient's symptoms are not adequately controlled.  Therefore, I recommended banding to treat the hemorrhoids.  I went over the technique, risks, benefits, and alternatives.   Goals of post-operative recovery were discussed as well.  Questions were answered.  The patient expressed understanding & wished to proceed...........Marland KitchenMarland KitchenThe patient was positioned in the lateral position on the proctology table.  Perianal & rectal examination was done.  Using anoscopy, I ligated the LL hemorrhoid above the dentate line with banding.  The patient tolerated the procedure well.  Educational handouts further explaining the pathology, treatment options, and bowel regimen were given as well.  Performed: 04/27/2020 10:40  AM  Assessment & Plan Leighton Ruff MD; 5/63/8937 10:40 AM) PROLAPSED INTERNAL HEMORRHOIDS, GRADE 2 (K64.1) Impression: 70 year old female who feels prolapsing tissue with bowel movements. She did not get any improvement with rubber band ligation of the right anterior hemorrhoid. We have perform a rubber band ligation of the left lateral hemorrhoid as well. On exam today, the right posterior hemorrhoid did not appear large enough to hold a rubber band. We will see if this helps with her symptoms. If she continues to have issues with drainage and bleeding, I would recommend repeat colonoscopy. Current  Plans Follow up with [place] in the office in 1 month.   Call us sooner as needed.  HEMORRHOIDECTOMY, INTERNAL, RUBBER BAND LIGATION ([id number])  #####################################  Gilman Buttner Appointment: 03/24/2020 9:30 AM Location: [facility] Patient #: [id number] DOB: Mar 29, 1950 Married / Language: Cleophus Molt / Race: White Female  History of Present Illness Leighton Ruff MD; 2/62/0355 9:47 AM) The patient is a 70 year old female who presents with hemorrhoids. 70 year old female who presented to the office for evaluation of a prolapsing hemorrhoid.  She stated that she has noticed this for several months.  She reports mucous drainage and urgency.  She has rectal bleeding almost on a daily basis.  Last colonoscopy was over 10 years ago.  She reports regular bowel habits and denies any straining for bowel movements.  I recommended that she start a fiber supplement and we gave her a round of hemorrhoid suppositories.  She has completed this and is back today for rubber band ligation.  Allergies Mammie Lorenzo, LPN; 9/74/1638 4:53 AM) No Known Drug Allergies   [03/02/2020]: Allergies Reconciled    Medication History Mammie Lorenzo, LPN; 6/46/8032 1:22 AM) Anusol-HC  (25MG  Suppository, 1 (one) Rectal once a da, Taken starting 03/02/2020) Active. Rosuvastatin Calcium  (10MG  Cap Sprinkle, Oral) Active. valACYclovir HCl  (500MG  Tablet, Oral) Active. Elestat  (0.05% Solution, Ophthalmic) Active. Calcium Carbonate  (1250 (500 Ca)MG Tablet Chewable, Oral) Active. Glucosamine  (500MG  Capsule, Oral) Active. Multi-Day  (Oral) Active. Fish Oil  (500MG  Capsule, Oral) Active. LORazepam  (0.5MG  Tablet, Oral) Active. Medications Reconciled   Physical Exam Leighton Ruff MD; 4/82/5003 9:48 AM) General Mental Status - Alert. General Appearance - Cooperative.  Rectal Anorectal Exam External - skin tag  (noninflamed) . Internal - generalized tenderness.  Results Leighton Ruff MD;  07/07/8887 9:46 AM) Procedures  Name Value Date HEMORRHOIDECTOMY, INTERNAL, RUBBER BAND LIGATION (16945) [ Hemorrhoids ] Procedure   Other: The anatomy & physiology of the anorectal region was discussed.  The pathophysiology of hemorrhoids and differential diagnosis was discussed.  Natural history progression  was discussed.   I stressed the importance of a bowel regimen to have daily soft bowel movements to minimize progression of disease.   ..........Marland KitchenMarland KitchenThe patient's symptoms are not adequately controlled.  Therefore, I recommended banding to treat the hemorrhoids.  I went over the technique, risks, benefits, and alternatives.   Goals of post-operative recovery were discussed as well.  Questions were answered.  The patient expressed understanding & wished to proceed...........Marland KitchenMarland KitchenThe patient was positioned in the lateral position on the proctology table.  Perianal & rectal examination was done.  Using anoscopy, I ligated the RA hemorrhoid above the dentate line with banding.  The patient tolerated the procedure fairly well.  Educational handouts further explaining the pathology, treatment options, and bowel regimen were given as well.  Performed: 03/24/2020 9:46 AM  Assessment & Plan Leighton Ruff MD; 0/38/8828 9:48 AM) PROLAPSED INTERNAL HEMORRHOIDS, GRADE 2 (  K64.1) Impression: 70 year old female who presents to the office for rubber band ligation. She had some discomfort with the procedure today. We decided to ligate the symptomatic appearing hemorrhoid, which was her right anterior. She tolerated this fairly well. I recommended that she continue the fiber supplement on a daily basis and I will see her back in the office in approximately one month. We will then evaluate her symptoms and decide if further rubber band ligation as needed. I believe her right posterior hemorrhoid could be rubber band ligated if needed. Current Plans Follow up with [place] in the office in 1 month.   Call us sooner  as needed.  HEMORRHOIDECTOMY, INTERNAL, RUBBER BAND LIGATION ([id number])  ##################################  Gilman Buttner Appointment: 03/02/2020 9:10 AM Location: [facility] Patient #: [id number] DOB: [birth year] Married / Language: Cleophus Molt / Race: White Female  History of Present Illness Leighton Ruff MD; 3/50/0938 9:15 AM) The patient is a 70 year old female who presents with hemorrhoids. 70 year old female who presents to the office for evaluation of a prolapsing hemorrhoid.  She states that she has noticed this for several months.  She reports mucous drainage and urgency.  She has rectal bleeding almost on a daily basis.  Last colonoscopy was over 10 years ago.  She reports regular bowel habits and denies any straining for bowel movements.  Past Surgical History Janeann Forehand, CNA; 03/02/2020 8:58 AM) Hip Surgery   Right. Hysterectomy (not due to cancer) - Partial    Diagnostic Studies History Janeann Forehand, CNA; 03/02/2020 8:58 AM) Colonoscopy   >10 years ago Pap Smear   >5 years ago  Allergies Janeann Forehand, CNA; 03/02/2020 8:59 AM) No Known Drug Allergies   [03/02/2020]: Allergies Reconciled    Medication History Janeann Forehand, CNA; 03/02/2020 9:01 AM) Rosuvastatin Calcium  (10MG  Cap Sprinkle, Oral) Active. valACYclovir HCl  (500MG  Tablet, Oral) Active. Elestat  (0.05% Solution, Ophthalmic) Active. Calcium Carbonate  (1250 (500 Ca)MG Tablet Chewable, Oral) Active. Glucosamine  (500MG  Capsule, Oral) Active. Multi-Day  (Oral) Active. Fish Oil  (500MG  Capsule, Oral) Active. LORazepam  (0.5MG  Tablet, Oral) Active. Medications Reconciled   Social History Janeann Forehand, CNA; 03/02/2020 8:58 AM) Caffeine use   Coffee. No alcohol use   No drug use   Tobacco use   Never smoker.  Family History Janeann Forehand, CNA; 03/02/2020 8:58 AM) Breast Cancer   Daughter. Cerebrovascular Accident   Father. Diabetes Mellitus   Daughter, Father, Son. Heart Disease    Father, Mother. Hypertension   Father. Melanoma   Brother. Respiratory Condition   Mother.  Pregnancy / Birth History Janeann Forehand, CNA; 03/02/2020 8:58 AM) Age at menarche   54 years. Irregular periods   Maternal age   39-20  Other Problems Janeann Forehand, CNA; 03/02/2020 8:58 AM) Arthritis   Hemorrhoids   High blood pressure   Hypercholesterolemia    Review of Systems Janeann Forehand CNA; 03/02/2020 8:58 AM) General Present- Weight Gain. Not Present- Appetite Loss, Chills, Fatigue, Fever, Night Sweats and Weight Loss. Skin Not Present- Change in Wart/Mole, Dryness, Hives, Jaundice, New Lesions, Non-Healing Wounds, Rash and Ulcer. HEENT Present- Wears glasses/contact lenses. Not Present- Earache, Hearing Loss, Hoarseness, Nose Bleed, Oral Ulcers, Ringing in the Ears, Seasonal Allergies, Sinus Pain, Sore Throat, Visual Disturbances and Yellow Eyes. Respiratory Not Present- Bloody sputum, Chronic Cough, Difficulty Breathing, Snoring and Wheezing. Breast Not Present- Breast Mass, Breast Pain, Nipple Discharge and Skin Changes. Cardiovascular Not Present- Chest Pain, Difficulty Breathing Lying Down, Leg Cramps, Palpitations, Rapid Heart Rate, Shortness  of Breath and Swelling of Extremities. Gastrointestinal Present- Hemorrhoids. Not Present- Abdominal Pain, Bloating, Bloody Stool, Change in Bowel Habits, Chronic diarrhea, Constipation, Difficulty Swallowing, Excessive gas, Gets full quickly at meals, Indigestion, Nausea, Rectal Pain and Vomiting. Female Genitourinary Not Present- Frequency, Nocturia, Painful Urination, Pelvic Pain and Urgency. Musculoskeletal Present- Joint Pain. Not Present- Back Pain, Joint Stiffness, Muscle Pain, Muscle Weakness and Swelling of Extremities. Neurological Not Present- Decreased Memory, Fainting, Headaches, Numbness, Seizures, Tingling, Tremor, Trouble walking and Weakness. Psychiatric Not Present- Anxiety, Bipolar, Change in Sleep Pattern, Depression,  Fearful and Frequent crying. Endocrine Not Present- Cold Intolerance, Excessive Hunger, Hair Changes, Heat Intolerance, Hot flashes and New Diabetes. Hematology Not Present- Blood Thinners, Easy Bruising, Excessive bleeding, Gland problems, HIV and Persistent Infections.  Vitals (Donyelle Alston CNA; 03/02/2020 9:02 AM) 03/02/2020 9:01 AM Weight: 179.25 lb   Height: 63 in  Body Surface Area: 1.85 m   Body Mass Index: 31.75 kg/m   Temp.: 97.6 F    Pulse: 98 (Regular)    P.OX: 99% (Room air) BP: 160/80(Sitting, Left Arm, Standard)  Physical Exam Leighton Ruff MD; 7/49/4496 9:17 AM) General Mental Status - Alert. General Appearance - Cooperative.  Rectal Anorectal Exam External - normal external exam. Internal - generalized tenderness.  Results Leighton Ruff MD; 7/59/1638 9:18 AM) Procedures  Name Value Date ANOSCOPY, DIAGNOSTIC (46659) [ Hemorrhoids ] Procedure   Other: Procedure: Anoscopy....Marland KitchenMarland KitchenSurgeon: Marcello Moores....Marland KitchenMarland KitchenAfter the risks and benefits were explained, verbal consent was obtained for above procedure.  A medical assistant chaperone was present thoroughout the entire procedure. ....Marland KitchenMarland KitchenAnesthesia: none....Marland KitchenMarland KitchenDiagnosis: Rectal bleeding....Marland KitchenMarland KitchenFindings: Grade 2, inflamed right anterior, right posterior hemorrhoids.  Grade 1 left lateral hemorrhoid.  Performed: 03/02/2020 9:17 AM  Assessment & Plan Leighton Ruff MD; 9/35/7017 9:17 AM) PROLAPSED INTERNAL HEMORRHOIDS, GRADE 2 (K64.1) Impression: 70 year old female who presents to the office with complaints of prolapsing hemorrhoids and bleeding. On exam, this appears to be caused by a right anterior and right posterior internal hemorrhoid, grade 2. She was rather tender on exam today. I recommended using a fiber supplement daily and suppository daily for the next week. She should continue the fiber supplement and follow-up with me in 3 weeks for attempt at rubber band ligation of her 2 hemorrhoid columns. We have discussed  this in detail including risk and recurrence rates. All questions were answered. I also strongly encouraged her to get her screening colonoscopy done, since she is overdue for this. Current Plans Follow up with [place] in the office in 3 WEEKS.   Call us sooner as needed.  Started Anusol-HC 25 MG Rectal Suppository, 1 (one) suppository once a da, 10 Suppository, 03/02/2020, Ref. x1. Pt Education - [facility] Hemorrhoids (AT) ANOSCOPY, DIAGNOSTIC (79390)  SURGERY NOTES:  Located in [place] section of Epic EMR chart  PATHOLOGY:  None  Assessment and Plan:  DIAGNOSES:  Diagnoses and all orders for this visit:  Rectal mucosa prolapse - [facility] Case Posting Request; Future  Prolapsed internal hemorrhoids, grade 3 - [facility] Case Posting Request; Future  Anal pruritus - [facility] Case Posting Request; Future  Abscess of left hip  Other orders - oxyCODONE 5 mg TbOr immediate release tablet; Take 1 tablet (5 mg total) by mouth every 6 (six) hours as needed (moderate or severe pain) - menthol-zinc oxide (CALMOSEPTINE) 0.44-20.6 %; Apply 1 Application topically every 6 (six) hours as needed (perianal itching/pain) Rub a small pea-sized drop to anus to help numb pain - STOP IF CAUSES PAIN/BURNING    ASSESSMENT/PLAN  Patient with persistent prolapse  of part of her rectal wall almost like a giant prolapsed hemorrhoid with irritation and inflammation. It is now on all the time with some incontinence and chronic mucus drainage. She is quite miserable and frustrated.  I think this requires emergent surgery. Hopefully outpatient anorectal examination under anesthesia. The mass is reducible and it appears to be pedunculated on the right posterior aspect. Hopefully this will be just like a large hemorrhoidectomy. Husband wondering if I can deal with the other hemorrhoids. I am a little hesitant to be too aggressive at this time and would focus on the prolapsing material. I do not get a strong sense of full  circumferential procidentia prolapse. We will try and fit in this week. I wrote for some narcotics and topical Calmoseptine to make it more manageable. Continue over-the-counter pain medications and warm soaks.  The anatomy & physiology of the anorectal region was discussed. The pathophysiology of hemorrhoids and differential diagnosis was discussed. Natural history risks without surgery was discussed. I stressed the importance of a bowel regimen to have daily soft bowel movements to minimize progression of disease. Interventions such as sclerotherapy & banding were discussed.  The patient's symptoms are not adequately controlled by medicines and other non-operative treatments. I feel the risks & problems of no surgery outweigh the operative risks; therefore, I recommended surgery to treat the hemorrhoids by ligation, pexy, and possible resection.  Risks such as bleeding, infection, urinary difficulties, injury to other organs, need for repair of tissues / organs, need for further treatment, heart attack, death, and other risks were discussed. I noted a good likelihood this will help address the problem. Goals of post-operative recovery were discussed as well. Possibility that this will not correct all symptoms was explained. Post-operative pain, bleeding, constipation, and other problems after surgery were discussed. We will work to minimize complications. Educational handouts further explaining the pathology, treatment options, and bowel regimen were given as well. Questions were answered. The patient expresses understanding & wishes to proceed with surgery.  Fiber bowel regimen to help thicken and soften her stools.  It is very odd that she got a left deep muscle hip abscess. I see no evidence of any abscess or infection related to the hemorrhoidal banding's. She never had any evidence of abscess necrosis or phlegmon. There is no evidence of infection right now. It seems to be resolved with aspiration  and IV and now oral antibiotics. I will defer back to orthopedic surgery. Sounds like they want to keep her on oral Bactrim therapy until the rectal surgery can happen in the hopes that we will make a recurrent abscess less likely. That seems reasonable.  FOLLOWUP: Return for Plan to schedule surgery. See instructions.  I had direct face-to-face contact with the patient for a total of 35 minutes and greater than 50% of that time was spent providing counseling and/or coordination of care for the patient regardingabove.  ########################################################  Adin Hector, MD, FACS, MASCRS Esophageal, Gastrointestinal & Colorectal Surgery Robotic and Minimally Invasive Surgery  [facility], [place]  [place] [place], [place] [address] [phone] Fax [phone] Main  CONTACT INFORMATION:  Weekday (9AM-5PM): Call [facility] main office at [phone]  Weeknight (5PM-9AM) or Weekend/Holiday: Check [email or web address] (password " [email or web address]") for General Surgery [facility] coverage  (Please, do not use SecureChat as it is not reliable communication to operating surgeons for immediate patient care)

## 2020-07-21 NOTE — Anesthesia Procedure Notes (Signed)
Procedure Name: Intubation Date/Time: 07/21/2020 2:21 PM Performed by: Suan Halter, CRNA Pre-anesthesia Checklist: Patient identified, Emergency Drugs available, Suction available and Patient being monitored Patient Re-evaluated:Patient Re-evaluated prior to induction Oxygen Delivery Method: Circle system utilized Preoxygenation: Pre-oxygenation with 100% oxygen Induction Type: IV induction Ventilation: Mask ventilation without difficulty Laryngoscope Size: Mac and 3 Grade View: Grade I Tube type: Oral Tube size: 7.0 mm Number of attempts: 1 Airway Equipment and Method: Stylet and Oral airway Placement Confirmation: ETT inserted through vocal cords under direct vision, positive ETCO2 and breath sounds checked- equal and bilateral Secured at: 22 cm Tube secured with: Tape Dental Injury: Teeth and Oropharynx as per pre-operative assessment

## 2020-07-22 ENCOUNTER — Encounter (HOSPITAL_BASED_OUTPATIENT_CLINIC_OR_DEPARTMENT_OTHER): Payer: Self-pay | Admitting: Surgery

## 2020-07-22 LAB — SURGICAL PATHOLOGY

## 2020-07-22 NOTE — Anesthesia Postprocedure Evaluation (Signed)
Anesthesia Post Note  Patient: Laura Wright  Procedure(s) Performed: EXAM UNDER ANESTHESIA WITH HEMORRHOIDECTOMY WITH LIGATION AND HEMORRHOIDOPEXY; REMOVAL OF PROLAPSING RECTAL TISSUE (Rectum)     Patient location during evaluation: PACU Anesthesia Type: General Level of consciousness: awake and alert Pain management: pain level controlled Vital Signs Assessment: post-procedure vital signs reviewed and stable Respiratory status: spontaneous breathing, nonlabored ventilation, respiratory function stable and patient connected to nasal cannula oxygen Cardiovascular status: blood pressure returned to baseline and stable Postop Assessment: no apparent nausea or vomiting Anesthetic complications: no   No notable events documented.  Last Vitals:  Vitals:   07/21/20 1639 07/21/20 2001  BP: 136/75 (!) 144/97  Pulse:  89  Resp:  13  Temp:  36.5 C  SpO2:  98%    Last Pain:  Vitals:   07/21/20 2001  TempSrc:   PainSc: 2    Pain Goal: Patients Stated Pain Goal: 3 (07/21/20 1615)                 Catalina Gravel

## 2020-07-29 ENCOUNTER — Encounter (HOSPITAL_COMMUNITY): Payer: Medicare Other

## 2020-12-07 ENCOUNTER — Ambulatory Visit: Payer: Self-pay | Admitting: Surgery

## 2020-12-07 DIAGNOSIS — R739 Hyperglycemia, unspecified: Secondary | ICD-10-CM

## 2021-01-06 NOTE — Progress Notes (Addendum)
COVID swab appointment: 01/20/21 DOS  COVID Vaccine Completed: yes x4 Date COVID Vaccine completed: Has received booster: COVID vaccine manufacturer: [employer]   Date of COVID positive in last 90 days: no  PCP - Duwaine Maxin, DO Cardiologist - n/a  Bowel prep: pt received instructions  Chest x-ray - n/a EKG - 07/21/20 Epic Stress Test - n/a ECHO - n/a Cardiac Cath - n/a Pacemaker/ICD device last checked: n/a Spinal Cord Stimulator: n/a  Sleep Study - n/a CPAP -   Fasting Blood Sugar - n/a Checks Blood Sugar _____ times a day  Blood Thinner Instructions: n/a Aspirin Instructions: Last Dose:  Activity level: Can go up a flight of stairs and perform activities of daily living without stopping and without symptoms of chest pain or shortness of breath.     Anesthesia review: PAT BP 150/102, pt is asymptomatic and reports she took her Hyzaar this morning. Brought to [place], [place] she wants pt to check BP at home over the next few days and call PCP if diastolic is greater than 90. Patient verbalized understanding. HTN  Patient denies shortness of breath, fever, cough and chest pain at PAT appointment   Patient verbalized understanding of instructions that were given to them at the PAT appointment. Patient was also instructed that they will need to review over the PAT instructions again at home before surgery.

## 2021-01-06 NOTE — Patient Instructions (Addendum)
DUE TO COVID-19 ONLY ONE VISITOR IS ALLOWED TO COME WITH YOU AND STAY IN THE WAITING ROOM ONLY DURING PRE OP AND PROCEDURE.   **NO VISITORS ARE ALLOWED IN THE SHORT STAY AREA OR RECOVERY ROOM!!**  IF YOU WILL BE ADMITTED INTO THE HOSPITAL YOU ARE ALLOWED ONLY TWO SUPPORT PEOPLE DURING VISITATION HOURS ONLY (7 AM -8PM)   The support person(s) must pass our screening, gel in and out, and wear a mask at all times, including in the patients room. Patients must also wear a mask when staff or their support person are in the room. Visitors GUEST BADGE MUST BE WORN VISIBLY  One adult visitor may remain with you overnight and MUST be in the room by 8 P.M.  No visitors under the age of 46. Any visitor under the age of 65 must be accompanied by an adult.    COVID SWAB TESTING MUST BE COMPLETED ON:  01/20/21       Your procedure is scheduled on: 01/20/21   Report to [facility] Main Entrance    Report to admitting at 10:15 AM   Call this number if you have problems the morning of surgery [phone]   Follow bowel prep instructions given to you by surgeons office the day before your surgery   May have liquids until 10:15 AM day of surgery  CLEAR LIQUID DIET  Foods Allowed                                                                     Foods Excluded  Water, Black Coffee and tea (no milk or creamer)          liquids that you cannot  Plain Jell-O in any flavor  (No red)                                   see through such as: Fruit ices (not with fruit pulp)                                           milk, soups, orange juice              Iced Popsicles (No red)                                               All solid food                                   Apple juices Sports drinks like Gatorade (No red) Lightly seasoned clear broth or consume(fat free) Sugar  Sample Menu Breakfast                                Lunch  Supper Cranberry juice                     Beef broth                            Chicken broth Jell-O                                     Grape juice                           Apple juice Coffee or tea                        Jell-O                                      Popsicle                                                Coffee or tea                        Coffee or tea      Drink 2 Ensure drinks the night before surgery, have finished by 10pm.  Complete one Ensure drink the morning of surgery 3 hours prior to scheduled surgery, have finished by 10:15 AM.     The day of surgery:  Drink ONE (1) Pre-Surgery Clear Ensure by 10:15 am the morning of surgery. Drink in one sitting. Do not sip.  This drink was given to you during your hospital  pre-op appointment visit. Nothing else to drink after completing the  Pre-Surgery Clear Ensure.          If you have questions, please contact your surgeons office.     Oral Hygiene is also important to reduce your risk of infection.                                    Remember - BRUSH YOUR TEETH THE MORNING OF SURGERY WITH YOUR REGULAR TOOTHPASTE   Stop taking all vitamins and supplements 7 days before surgery   Take these medicines the morning of surgery with A SIP OF WATER: Ativan, Crestor                              You may not have any metal on your body including hair pins, jewelry, and body piercing             Do not wear make-up, lotions, powders, perfumes, or deodorant  Do not wear nail polish including gel and S&S, artificial/acrylic nails, or any other type of covering on natural nails including finger and toenails. If you have artificial nails, gel coating, etc. that needs to be removed by a nail salon please have this removed prior to surgery or surgery may need to be canceled/ delayed if the surgeon/ anesthesia feels like they are unable to be safely monitored.  Do not shave  48 hours prior to surgery.    Do not bring valuables to the hospital. Laura Wright   Bring small overnight bag day of surgery.              Please read over the following fact sheets you were given: IF YOU HAVE QUESTIONS ABOUT YOUR PRE-OP INSTRUCTIONS PLEASE CALL [place] - Preparing for Surgery Before surgery, you can play an important role.  Because skin is not sterile, your skin needs to be as free of germs as possible.  You can reduce the number of germs on your skin by washing with CHG (chlorahexidine gluconate) soap before surgery.  CHG is an antiseptic cleaner which kills germs and bonds with the skin to continue killing germs even after washing. Please DO NOT use if you have an allergy to CHG or antibacterial soaps.  If your skin becomes reddened/irritated stop using the CHG and inform your nurse when you arrive at Short Stay. Do not shave (including legs and underarms) for at least 48 hours prior to the first CHG shower.  You may shave your face/neck.  Please follow these instructions carefully:  1.  Shower with CHG Soap the night before surgery and the  morning of surgery.  2.  If you choose to wash your hair, wash your hair first as usual with your normal  shampoo.  3.  After you shampoo, rinse your hair and body thoroughly to remove the shampoo.                             4.  Use CHG as you would any other liquid soap.  You can apply chg directly to the skin and wash.  Gently with a scrungie or clean washcloth.  5.  Apply the CHG Soap to your body ONLY FROM THE NECK DOWN.   Do   not use on face/ open                           Wound or open sores. Avoid contact with eyes, ears mouth and   genitals (private parts).                       Wash face,  Genitals (private parts) with your normal soap.             6.  Wash thoroughly, paying special attention to the area where your    surgery  will be performed.  7.  Thoroughly rinse your body with warm water from the neck down.  8.  DO NOT  shower/wash with your normal soap after using and rinsing off the CHG Soap.                9.  Pat yourself dry with a clean towel.            10.  Wear clean pajamas.            11.  Place clean sheets on your bed the night of your first shower and do not  sleep with pets. Day of Surgery : Do not apply any lotions/deodorants the morning of surgery.  Please wear clean clothes to the hospital/surgery center.  FAILURE TO FOLLOW THESE  INSTRUCTIONS MAY RESULT IN THE CANCELLATION OF YOUR SURGERY  PATIENT SIGNATURE_________________________________  NURSE SIGNATURE__________________________________  ________________________________________________________________________   Laura Wright  An incentive spirometer is a tool that can help keep your lungs clear and active. This tool measures how well you are filling your lungs with each breath. Taking long deep breaths may help reverse or decrease the chance of developing breathing (pulmonary) problems (especially infection) following: A long period of time when you are unable to move or be active. BEFORE THE PROCEDURE  If the spirometer includes an indicator to show your best effort, your nurse or respiratory therapist will set it to a desired goal. If possible, sit up straight or lean slightly forward. Try not to slouch. Hold the incentive spirometer in an upright position. INSTRUCTIONS FOR USE  Sit on the edge of your bed if possible, or sit up as far as you can in bed or on a chair. Hold the incentive spirometer in an upright position. Breathe out normally. Place the mouthpiece in your mouth and seal your lips tightly around it. Breathe in slowly and as deeply as possible, raising the piston or the ball toward the top of the column. Hold your breath for 3-5 seconds or for as long as possible. Allow the piston or ball to fall to the bottom of the column. Remove the mouthpiece from your mouth and breathe out normally. Rest for a few  seconds and repeat Steps 1 through 7 at least 10 times every 1-2 hours when you are awake. Take your time and take a few normal breaths between deep breaths. The spirometer may include an indicator to show your best effort. Use the indicator as a goal to work toward during each repetition. After each set of 10 deep breaths, practice coughing to be sure your lungs are clear. If you have an incision (the cut made at the time of surgery), support your incision when coughing by placing a pillow or rolled up towels firmly against it. Once you are able to get out of bed, walk around indoors and cough well. You may stop using the incentive spirometer when instructed by your caregiver.  RISKS AND COMPLICATIONS Take your time so you do not get dizzy or light-headed. If you are in pain, you may need to take or ask for pain medication before doing incentive spirometry. It is harder to take a deep breath if you are having pain. AFTER USE Rest and breathe slowly and easily. It can be helpful to keep track of a log of your progress. Your caregiver can provide you with a simple table to help with this. If you are using the spirometer at home, follow these instructions: [place] IF:  You are having difficultly using the spirometer. You have trouble using the spirometer as often as instructed. Your pain medication is not giving enough relief while using the spirometer. You develop fever of 100.5 F (38.1 C) or higher. SEEK IMMEDIATE MEDICAL CARE IF:  You cough up bloody sputum that had not been present before. You develop fever of 102 F (38.9 C) or greater. You develop worsening pain at or near the incision site. MAKE SURE YOU:  Understand these instructions. Will watch your condition. Will get help right away if you are not doing well or get worse. Document Released: 05/02/2006 Document Revised: 03/14/2011 Document Reviewed: 07/03/2006 [facility] Patient Information 2014 [place],  [place].   ________________________________________________________________________

## 2021-01-07 ENCOUNTER — Encounter (HOSPITAL_COMMUNITY)
Admission: RE | Admit: 2021-01-07 | Discharge: 2021-01-07 | Disposition: A | Discharge status: Home / Self Care | Payer: Medicare Other | Source: Ambulatory Visit | Attending: Surgery | Admitting: Surgery

## 2021-01-07 ENCOUNTER — Encounter (HOSPITAL_COMMUNITY): Payer: Self-pay

## 2021-01-07 ENCOUNTER — Other Ambulatory Visit: Payer: Self-pay

## 2021-01-07 VITALS — BP 150/102 | HR 84 | Temp 98.2°F | Resp 16 | Ht 64.5 in | Wt 175.6 lb

## 2021-01-07 DIAGNOSIS — I10 Essential (primary) hypertension: Secondary | ICD-10-CM | POA: Diagnosis not present

## 2021-01-07 DIAGNOSIS — Z01818 Encounter for other preprocedural examination: Secondary | ICD-10-CM

## 2021-01-07 DIAGNOSIS — R739 Hyperglycemia, unspecified: Secondary | ICD-10-CM | POA: Diagnosis not present

## 2021-01-07 DIAGNOSIS — Z01812 Encounter for preprocedural laboratory examination: Secondary | ICD-10-CM | POA: Diagnosis not present

## 2021-01-07 LAB — CBC
HCT: 42.8 % (ref 36.0–46.0)
Hemoglobin: 13.7 g/dL (ref 12.0–15.0)
MCH: 27.9 pg (ref 26.0–34.0)
MCHC: 32 g/dL (ref 30.0–36.0)
MCV: 87.2 fL (ref 80.0–100.0)
Platelets: 348 10*3/uL (ref 150–400)
RBC: 4.91 MIL/uL (ref 3.87–5.11)
RDW: 13.6 % (ref 11.5–15.5)
WBC: 5.9 10*3/uL (ref 4.0–10.5)
nRBC: 0 % (ref 0.0–0.2)

## 2021-01-07 LAB — BASIC METABOLIC PANEL
Anion gap: 4 — ABNORMAL LOW (ref 5–15)
BUN: 13 mg/dL (ref 8–23)
CO2: 29 mmol/L (ref 22–32)
Calcium: 9.3 mg/dL (ref 8.9–10.3)
Chloride: 100 mmol/L (ref 98–111)
Creatinine, Ser: 0.87 mg/dL (ref 0.44–1.00)
GFR, Estimated: >60 mL/min (ref >60)
Glucose, Bld: 98 mg/dL (ref 70–99)
Potassium: 4.1 mmol/L (ref 3.5–5.1)
Sodium: 133 mmol/L — ABNORMAL LOW (ref 135–145)

## 2021-01-07 LAB — TYPE AND SCREEN
ABO/RH(D): O POS
Antibody Screen: NEGATIVE

## 2021-01-07 LAB — HEMOGLOBIN A1C
Hgb A1c MFr Bld: 5.8 % — ABNORMAL HIGH (ref 4.8–5.6)
Mean Plasma Glucose: 119.76 mg/dL

## 2021-01-19 NOTE — Anesthesia Preprocedure Evaluation (Addendum)
Anesthesia Evaluation  Patient identified by MRN, date of birth, ID band Patient awake    Reviewed: Allergy & Precautions, NPO status , Patient's Chart, lab work & pertinent test results  Airway Mallampati: II  TM Distance: >3 FB Neck ROM: Full    Dental no notable dental hx. (+) Teeth Intact, Dental Advisory Given   Pulmonary neg pulmonary ROS,    Pulmonary exam normal breath sounds clear to auscultation       Cardiovascular hypertension, Pt. on medications Normal cardiovascular exam Rhythm:Regular Rate:Normal     Neuro/Psych PSYCHIATRIC DISORDERS    GI/Hepatic negative GI ROS, Neg liver ROS,   Endo/Other  negative endocrine ROS  Renal/GU Lab Results      Component                Value               Date                      CREATININE               0.87                01/07/2021                 NA                       133 (L)             01/07/2021                K                        4.1                 01/07/2021                      Musculoskeletal  (+) Arthritis , Osteoarthritis,    Abdominal   Peds  Hematology Lab Results      Component                Value               Date                           HGB                      13.7                01/07/2021                HCT                      42.8                01/07/2021                     PLT                      348                 01/07/2021              Anesthesia Other Findings ALL: macrdantin  Reproductive/Obstetrics  Anesthesia Physical Anesthesia Plan  ASA: 2  Anesthesia Plan: General   Post-op Pain Management: Lidocaine infusion and Ketamine IV   Induction: Intravenous  PONV Risk Score and Plan: 4 or greater and Treatment may vary due to age or medical condition, Midazolam, Dexamethasone and Ondansetron  Airway Management Planned: Oral ETT  Additional Equipment:  None  Intra-op Plan:   Post-operative Plan: Extubation in OR  Informed Consent: I have reviewed the patients History and Physical, chart, labs and discussed the procedure including the risks, benefits and alternatives for the proposed anesthesia with the patient or authorized representative who has indicated his/her understanding and acceptance.     Dental advisory given  Plan Discussed with: CRNA and Anesthesiologist  Anesthesia Plan Comments: (GA w lidocaine infusion + ketamine)       Anesthesia Quick Evaluation

## 2021-01-20 ENCOUNTER — Inpatient Hospital Stay (HOSPITAL_COMMUNITY): Payer: Medicare Other | Admitting: Certified Registered Nurse Anesthetist

## 2021-01-20 ENCOUNTER — Inpatient Hospital Stay (HOSPITAL_COMMUNITY): Payer: Medicare Other | Admitting: Physician Assistant

## 2021-01-20 ENCOUNTER — Encounter (HOSPITAL_COMMUNITY): Payer: Self-pay | Admitting: Surgery

## 2021-01-20 ENCOUNTER — Ambulatory Visit (HOSPITAL_COMMUNITY)
Admission: RE | Admit: 2021-01-20 | Discharge: 2021-01-20 | Disposition: A | Discharge status: Home / Self Care | Payer: Medicare Other | Attending: Surgery | Admitting: Surgery

## 2021-01-20 ENCOUNTER — Other Ambulatory Visit: Payer: Self-pay

## 2021-01-20 ENCOUNTER — Encounter (HOSPITAL_COMMUNITY)
Admission: RE | Disposition: A | Discharge status: Home / Self Care | Payer: Self-pay | Source: Home / Self Care | Attending: Surgery

## 2021-01-20 DIAGNOSIS — Z01818 Encounter for other preprocedural examination: Secondary | ICD-10-CM

## 2021-01-20 DIAGNOSIS — Z79899 Other long term (current) drug therapy: Secondary | ICD-10-CM | POA: Diagnosis not present

## 2021-01-20 DIAGNOSIS — K644 Residual hemorrhoidal skin tags: Secondary | ICD-10-CM | POA: Insufficient documentation

## 2021-01-20 DIAGNOSIS — K621 Rectal polyp: Secondary | ICD-10-CM | POA: Diagnosis present

## 2021-01-20 DIAGNOSIS — Z20822 Contact with and (suspected) exposure to covid-19: Secondary | ICD-10-CM | POA: Insufficient documentation

## 2021-01-20 DIAGNOSIS — M1990 Unspecified osteoarthritis, unspecified site: Secondary | ICD-10-CM | POA: Insufficient documentation

## 2021-01-20 DIAGNOSIS — I10 Essential (primary) hypertension: Secondary | ICD-10-CM | POA: Insufficient documentation

## 2021-01-20 HISTORY — PX: PARTIAL PROCTECTOMY BY TEM: SHX6011

## 2021-01-20 HISTORY — PX: HEMORRHOID SURGERY: SHX153

## 2021-01-20 LAB — SARS CORONAVIRUS 2 BY RT PCR (HOSPITAL ORDER, PERFORMED IN ~~LOC~~ HOSPITAL LAB): SARS Coronavirus 2: NEGATIVE

## 2021-01-20 SURGERY — PARTIAL PROCTECTOMY BY TEM
Anesthesia: General | Site: Rectum

## 2021-01-20 MED ORDER — ENOXAPARIN SODIUM 40 MG/0.4ML IJ SOSY
40.0000 mg | PREFILLED_SYRINGE | Freq: Once | INTRAMUSCULAR | Status: AC
  Administered 2021-01-20: 40 mg via SUBCUTANEOUS
  Filled 2021-01-20: qty 0.4

## 2021-01-20 MED ORDER — GABAPENTIN 300 MG PO CAPS
300.0000 mg | ORAL_CAPSULE | ORAL | Status: AC
  Administered 2021-01-20: 300 mg via ORAL
  Filled 2021-01-20: qty 1

## 2021-01-20 MED ORDER — PROPOFOL 10 MG/ML IV BOLUS
INTRAVENOUS | Status: AC
  Filled 2021-01-20: qty 20

## 2021-01-20 MED ORDER — KETAMINE HCL 10 MG/ML IJ SOLN
INTRAMUSCULAR | Status: DC | PRN
  Administered 2021-01-20: 40 mg via INTRAVENOUS
  Administered 2021-01-20: 10 mg via INTRAVENOUS

## 2021-01-20 MED ORDER — POLYETHYLENE GLYCOL 3350 17 GM/SCOOP PO POWD: 1.0000 | Freq: Once | ORAL | Status: DC

## 2021-01-20 MED ORDER — MIDAZOLAM HCL 2 MG/2ML IJ SOLN
INTRAMUSCULAR | Status: AC
  Filled 2021-01-20: qty 2

## 2021-01-20 MED ORDER — PHENYLEPHRINE HCL-NACL 20-0.9 MG/250ML-% IV SOLN
INTRAVENOUS | Status: DC | PRN
  Administered 2021-01-20: 35 ug/min via INTRAVENOUS

## 2021-01-20 MED ORDER — LIDOCAINE 2% (20 MG/ML) 5 ML SYRINGE
INTRAMUSCULAR | Status: DC | PRN
  Administered 2021-01-20: 100 mg via INTRAVENOUS

## 2021-01-20 MED ORDER — METRONIDAZOLE 500 MG PO TABS: 1000.0000 mg | ORAL_TABLET | ORAL | Status: DC

## 2021-01-20 MED ORDER — KETAMINE HCL 50 MG/5ML IJ SOSY
PREFILLED_SYRINGE | INTRAMUSCULAR | Status: AC
  Filled 2021-01-20: qty 5

## 2021-01-20 MED ORDER — 0.9 % SODIUM CHLORIDE (POUR BTL) OPTIME
TOPICAL | Status: DC | PRN
  Administered 2021-01-20: 1000 mL

## 2021-01-20 MED ORDER — ONDANSETRON HCL 4 MG/2ML IJ SOLN
4.0000 mg | Freq: Once | INTRAMUSCULAR | Status: AC | PRN
  Administered 2021-01-20: 4 mg via INTRAVENOUS

## 2021-01-20 MED ORDER — OXYCODONE-ACETAMINOPHEN 5-325 MG PO TABS: 1.0000 | ORAL_TABLET | Freq: Four times a day (QID) | ORAL | 0 refills | Status: DC | PRN

## 2021-01-20 MED ORDER — BUPIVACAINE-EPINEPHRINE 0.25% -1:200000 IJ SOLN
INTRAMUSCULAR | Status: DC | PRN
  Administered 2021-01-20: 20 mL

## 2021-01-20 MED ORDER — SUGAMMADEX SODIUM 200 MG/2ML IV SOLN
INTRAVENOUS | Status: DC | PRN
  Administered 2021-01-20: 200 mg via INTRAVENOUS

## 2021-01-20 MED ORDER — HYDROMORPHONE HCL 1 MG/ML IJ SOLN: 0.2500 mg | INTRAMUSCULAR | Status: DC | PRN

## 2021-01-20 MED ORDER — ONDANSETRON HCL 4 MG/2ML IJ SOLN
INTRAMUSCULAR | Status: DC | PRN
Start: 2021-01-20 — End: 2021-01-20
  Administered 2021-01-20: 4 mg via INTRAVENOUS

## 2021-01-20 MED ORDER — ENSURE PRE-SURGERY PO LIQD: 296.0000 mL | Freq: Once | ORAL | Status: DC

## 2021-01-20 MED ORDER — SODIUM CHLORIDE 0.9 % IV SOLN
2.0000 g | INTRAVENOUS | Status: AC
  Administered 2021-01-20: 2 g via INTRAVENOUS
  Filled 2021-01-20: qty 2

## 2021-01-20 MED ORDER — BUPIVACAINE LIPOSOME 1.3 % IJ SUSP: 20.0000 mL | Freq: Once | INTRAMUSCULAR | Status: DC

## 2021-01-20 MED ORDER — SODIUM CHLORIDE (PF) 0.9 % IJ SOLN
INTRAMUSCULAR | Status: AC
  Filled 2021-01-20: qty 20

## 2021-01-20 MED ORDER — ENSURE PRE-SURGERY PO LIQD: 592.0000 mL | Freq: Once | ORAL | Status: DC

## 2021-01-20 MED ORDER — PHENYLEPHRINE HCL (PRESSORS) 10 MG/ML IV SOLN
INTRAVENOUS | Status: AC
  Filled 2021-01-20: qty 1

## 2021-01-20 MED ORDER — NEOMYCIN SULFATE 500 MG PO TABS: 1000.0000 mg | ORAL_TABLET | ORAL | Status: DC

## 2021-01-20 MED ORDER — LIDOCAINE 20MG/ML (2%) 15 ML SYRINGE OPTIME: INTRAMUSCULAR | Status: DC | PRN

## 2021-01-20 MED ORDER — FENTANYL CITRATE (PF) 100 MCG/2ML IJ SOLN
INTRAMUSCULAR | Status: AC
  Filled 2021-01-20: qty 2

## 2021-01-20 MED ORDER — AMISULPRIDE (ANTIEMETIC) 5 MG/2ML IV SOLN: 10.0000 mg | Freq: Once | INTRAVENOUS | Status: DC | PRN

## 2021-01-20 MED ORDER — MIDAZOLAM HCL 2 MG/2ML IJ SOLN
INTRAMUSCULAR | Status: DC | PRN
  Administered 2021-01-20: 2 mg via INTRAVENOUS

## 2021-01-20 MED ORDER — BUPIVACAINE LIPOSOME 1.3 % IJ SUSP
INTRAMUSCULAR | Status: DC | PRN
  Administered 2021-01-20: 20 mL

## 2021-01-20 MED ORDER — OXYCODONE HCL 5 MG/5ML PO SOLN: 5.0000 mg | Freq: Once | ORAL | Status: AC | PRN

## 2021-01-20 MED ORDER — ACETAMINOPHEN 10 MG/ML IV SOLN: 1000.0000 mg | Freq: Once | INTRAVENOUS | Status: DC | PRN

## 2021-01-20 MED ORDER — OXYCODONE HCL 5 MG PO TABS
5.0000 mg | ORAL_TABLET | Freq: Once | ORAL | Status: AC | PRN
  Administered 2021-01-20: 5 mg via ORAL

## 2021-01-20 MED ORDER — LIDOCAINE HCL 2 % IJ SOLN
INTRAMUSCULAR | Status: AC
  Filled 2021-01-20: qty 20

## 2021-01-20 MED ORDER — ONDANSETRON HCL 4 MG/2ML IJ SOLN
INTRAMUSCULAR | Status: AC
  Filled 2021-01-20: qty 2

## 2021-01-20 MED ORDER — FENTANYL CITRATE (PF) 100 MCG/2ML IJ SOLN
INTRAMUSCULAR | Status: DC | PRN
  Administered 2021-01-20 (×3): 50 ug via INTRAVENOUS

## 2021-01-20 MED ORDER — ARTIFICIAL TEARS OPHTHALMIC OINT
TOPICAL_OINTMENT | OPHTHALMIC | Status: AC
  Filled 2021-01-20: qty 3.5

## 2021-01-20 MED ORDER — PROPOFOL 10 MG/ML IV BOLUS
INTRAVENOUS | Status: DC | PRN
Start: 2021-01-20 — End: 2021-01-20
  Administered 2021-01-20: 120 mg via INTRAVENOUS

## 2021-01-20 MED ORDER — LACTATED RINGERS IV SOLN: INTRAVENOUS | Status: DC

## 2021-01-20 MED ORDER — CHLORHEXIDINE GLUCONATE 0.12 % MT SOLN
15.0000 mL | Freq: Once | OROMUCOSAL | Status: AC
  Administered 2021-01-20: 15 mL via OROMUCOSAL

## 2021-01-20 MED ORDER — ALVIMOPAN 12 MG PO CAPS
12.0000 mg | ORAL_CAPSULE | ORAL | Status: AC
  Administered 2021-01-20: 12 mg via ORAL
  Filled 2021-01-20: qty 1

## 2021-01-20 MED ORDER — CHLORHEXIDINE GLUCONATE CLOTH 2 % EX PADS: 6.0000 | MEDICATED_PAD | Freq: Once | CUTANEOUS | Status: DC

## 2021-01-20 MED ORDER — DEXAMETHASONE SODIUM PHOSPHATE 10 MG/ML IJ SOLN
INTRAMUSCULAR | Status: DC | PRN
  Administered 2021-01-20: 5 mg via INTRAVENOUS

## 2021-01-20 MED ORDER — LIDOCAINE 2% (20 MG/ML) 5 ML SYRINGE
INTRAMUSCULAR | Status: DC | PRN
  Administered 2021-01-20: 1.5 mg/kg/h via INTRAVENOUS

## 2021-01-20 MED ORDER — DIBUCAINE (PERIANAL) 1 % EX OINT
TOPICAL_OINTMENT | CUTANEOUS | Status: AC
  Filled 2021-01-20: qty 28

## 2021-01-20 MED ORDER — DEXAMETHASONE SODIUM PHOSPHATE 10 MG/ML IJ SOLN
INTRAMUSCULAR | Status: AC
  Filled 2021-01-20: qty 1

## 2021-01-20 MED ORDER — ROCURONIUM BROMIDE 10 MG/ML (PF) SYRINGE
PREFILLED_SYRINGE | INTRAVENOUS | Status: DC | PRN
  Administered 2021-01-20: 60 mg via INTRAVENOUS

## 2021-01-20 MED ORDER — EPHEDRINE SULFATE-NACL 50-0.9 MG/10ML-% IV SOSY
PREFILLED_SYRINGE | INTRAVENOUS | Status: DC | PRN
Start: 2021-01-20 — End: 2021-01-20
  Administered 2021-01-20 (×2): 10 mg via INTRAVENOUS

## 2021-01-20 MED ORDER — LIDOCAINE 2% (20 MG/ML) 5 ML SYRINGE
INTRAMUSCULAR | Status: AC
  Filled 2021-01-20: qty 5

## 2021-01-20 MED ORDER — ACETAMINOPHEN 500 MG PO TABS
1000.0000 mg | ORAL_TABLET | ORAL | Status: AC
  Administered 2021-01-20: 1000 mg via ORAL
  Filled 2021-01-20: qty 2

## 2021-01-20 MED ORDER — BISACODYL 5 MG PO TBEC: 20.0000 mg | DELAYED_RELEASE_TABLET | Freq: Once | ORAL | Status: DC

## 2021-01-20 MED ORDER — OXYCODONE HCL 5 MG PO TABS
ORAL_TABLET | ORAL | Status: AC
  Filled 2021-01-20: qty 1

## 2021-01-20 MED ORDER — OXYCODONE HCL 5 MG PO TABS: 5.0000 mg | ORAL_TABLET | Freq: Four times a day (QID) | ORAL | 0 refills | Status: DC | PRN

## 2021-01-20 MED ORDER — ORAL CARE MOUTH RINSE: 15.0000 mL | Freq: Once | OROMUCOSAL | Status: AC

## 2021-01-20 MED ORDER — BUPIVACAINE LIPOSOME 1.3 % IJ SUSP
INTRAMUSCULAR | Status: AC
  Filled 2021-01-20: qty 20

## 2021-01-20 MED ORDER — BUPIVACAINE-EPINEPHRINE (PF) 0.25% -1:200000 IJ SOLN
INTRAMUSCULAR | Status: AC
  Filled 2021-01-20: qty 30

## 2021-01-20 SURGICAL SUPPLY — 53 items
BAG COUNTER SPONGE SURGICOUNT (BAG) IMPLANT
BLADE SURG 15 STRL LF DISP TIS (BLADE) IMPLANT
BLADE SURG 15 STRL SS (BLADE)
CABLE HIGH FREQUENCY MONO STRZ (ELECTRODE) ×3 IMPLANT
DRAPE LAPAROTOMY T 102X78X121 (DRAPES) IMPLANT
DRAPE SURG IRRIG POUCH 19X23 (DRAPES) ×3 IMPLANT
DRAPE UNDERBUTTOCKS STRL (DISPOSABLE) ×1 IMPLANT
DRAPE WARM FLUID 44X44 (DRAPES) ×3 IMPLANT
DRSG PAD ABDOMINAL 8X10 ST (GAUZE/BANDAGES/DRESSINGS) IMPLANT
ELECT PENCIL ROCKER SW 15FT (MISCELLANEOUS) ×1 IMPLANT
ELECT REM PT RETURN 15FT ADLT (MISCELLANEOUS) ×3 IMPLANT
GAUZE 4X4 16PLY ~~LOC~~+RFID DBL (SPONGE) ×3 IMPLANT
GAUZE SPONGE 4X4 12PLY STRL (GAUZE/BANDAGES/DRESSINGS) ×1 IMPLANT
GLOVE SURG NEOPR MICRO LF SZ8 (GLOVE) ×6 IMPLANT
GLOVE SURG UNDER LTX SZ8 (GLOVE) ×6 IMPLANT
GOWN STRL REUS W/TWL XL LVL3 (GOWN DISPOSABLE) ×9 IMPLANT
IRRIG SUCT STRYKERFLOW 2 WTIP (MISCELLANEOUS) ×3
IRRIGATION SUCT STRKRFLW 2 WTP (MISCELLANEOUS) ×2 IMPLANT
KIT BASIN OR (CUSTOM PROCEDURE TRAY) ×3 IMPLANT
KIT TURNOVER KIT A (KITS) IMPLANT
LEGGING LITHOTOMY PAIR STRL (DRAPES) ×3 IMPLANT
NEEDLE HYPO 22GX1.5 SAFETY (NEEDLE) ×3 IMPLANT
PACK BASIC VI WITH GOWN DISP (CUSTOM PROCEDURE TRAY) ×3 IMPLANT
PAD POSITIONING PINK XL (MISCELLANEOUS) ×3 IMPLANT
PANTS MESH DISP LRG (UNDERPADS AND DIAPERS) IMPLANT
PANTS MESH DISPOSABLE L (UNDERPADS AND DIAPERS)
PENCIL SMOKE EVACUATOR (MISCELLANEOUS) IMPLANT
RETRACTOR LONE STAR DISPOSABLE (INSTRUMENTS) IMPLANT
RETRACTOR STAY HOOK 5MM (MISCELLANEOUS) IMPLANT
SCISSORS LAP 5X35 DISP (ENDOMECHANICALS) ×3 IMPLANT
SET TUBE SMOKE EVAC HIGH FLOW (TUBING) ×3 IMPLANT
SHEARS HARMONIC ACE PLUS 36CM (ENDOMECHANICALS) ×3 IMPLANT
STOPCOCK 4 WAY LG BORE MALE ST (IV SETS) IMPLANT
SURGILUBE 2OZ TUBE FLIPTOP (MISCELLANEOUS) ×3 IMPLANT
SUT CHROMIC 3 0 SH 27 (SUTURE) ×1 IMPLANT
SUT PDS AB 2-0 CT2 27 (SUTURE) IMPLANT
SUT PDS AB 3-0 SH 27 (SUTURE) IMPLANT
SUT SILK 2 0 (SUTURE)
SUT SILK 2 0 SH CR/8 (SUTURE) IMPLANT
SUT SILK 2-0 18XBRD TIE 12 (SUTURE) IMPLANT
SUT SILK 3 0 SH 30 (SUTURE) IMPLANT
SUT SILK 3 0 SH CR/8 (SUTURE) IMPLANT
SUT V-LOC BARB 180 2/0GR6 GS22 (SUTURE)
SUT VIC AB 2-0 UR6 27 (SUTURE) ×10 IMPLANT
SUT VIC AB 3-0 SH 27 (SUTURE)
SUT VIC AB 3-0 SH 27XBRD (SUTURE) IMPLANT
SUTURE V-LC BRB 180 2/0GR6GS22 (SUTURE) IMPLANT
SYR 20ML LL LF (SYRINGE) ×3 IMPLANT
SYR BULB IRRIG 60ML STRL (SYRINGE) IMPLANT
TOWEL OR 17X26 10 PK STRL BLUE (TOWEL DISPOSABLE) ×3 IMPLANT
TOWEL OR NON WOVEN STRL DISP B (DISPOSABLE) ×3 IMPLANT
TRAY FOLEY MTR SLVR 14FR STAT (SET/KITS/TRAYS/PACK) ×1 IMPLANT
TUBING CONNECTING 10 (TUBING) IMPLANT

## 2021-01-20 NOTE — Anesthesia Procedure Notes (Signed)
Procedure Name: Intubation Date/Time: 01/20/2021 12:12 PM Performed by: Rosaland Lao, CRNA Pre-anesthesia Checklist: Patient identified, Emergency Drugs available, Suction available and Patient being monitored Patient Re-evaluated:Patient Re-evaluated prior to induction Oxygen Delivery Method: Circle system utilized Preoxygenation: Pre-oxygenation with 100% oxygen Induction Type: IV induction Ventilation: Mask ventilation without difficulty Laryngoscope Size: Miller and 2 Grade View: Grade I Tube type: Oral Tube size: 7.0 mm Number of attempts: 1 Airway Equipment and Method: Stylet Placement Confirmation: ETT inserted through vocal cords under direct vision, positive ETCO2 and breath sounds checked- equal and bilateral Secured at: 22 cm Tube secured with: Tape Dental Injury: Teeth and Oropharynx as per pre-operative assessment

## 2021-01-20 NOTE — Discharge Instructions (Signed)
##############################################  ANORECTAL SURGERY:  POST OPERATIVE INSTRUCTIONS  ######################################################################  EAT Start with a pureed / full liquid diet After 24 hours, gradually transition to a high fiber diet.    CONTROL PAIN Control pain so you can tolerate bowel movements,  walk, sleep, tolerate sneezing/coughing, and go up/down stairs.   HAVE A BOWEL MOVEMENT DAILY Keep your bowels regular to avoid problems.   Taking a fiber supplement every day to keep bowels soft.   Try a laxative to override constipation. Use an antidairrheal to slow down diarrhea.   Call if not better after 2 tries  WALK Walk an hour a day.  Control your pain to do that.   CALL IF YOU HAVE PROBLEMS/CONCERNS Call if you are still struggling despite following these instructions. Call if you have concerns not answered by these instructions  ######################################################################    Take your usually prescribed home medications unless otherwise directed.  DIET: Follow a light bland diet & liquids the first 24 hours after arrival home, such as soup, liquids, starches, etc.  Be sure to drink plenty of fluids.  Quickly advance to a usual solid diet within a few days.  Avoid fast food or heavy meals as your are more likely to get nauseated or have irregular bowels.  A low-fat, high-fiber diet for the rest of your life is ideal.  PAIN CONTROL: Expect swelling and discomfort in the anus/rectal area. Pain is best controlled by a usual combination of many methods TOGETHER: Warm baths/soaks or Ice packs Over the counter pain medication Prescription pain medications Topical creams    Warm water baths or ice packs (30-60 minutes up to 8 times a day, especially after bowel meovements) will help. Use ice for the first few days to help decrease swelling and bruising, then switch to heat such as warm towels, sitz baths, warm  baths, warm showers, etc to help relax tight/sore spots and speed recovery.  Some people prefer to use ice alone, heat alone, alternating between ice & heat.  Experiment to what works for you.    It is helpful to take an over-the-counter pain medication continuously for the first few weeks.  Choose one of the following that works best for you: Naproxen (Aleve, etc)  Two 220mg  tabs twice a day Ibuprofen (Advil, etc) Three 200mg  tabs four times a day (every meal & bedtime) Acetaminophen (Tylenol, etc) 500-650mg  four times a day (every meal & bedtime)  A  prescription for pain medication (such as oxycodone, hydrocodone, etc) should be given to you upon discharge.  Take your pain medication as prescribed.  If you are having problems/concerns with the prescription medicine (does not control pain, nausea, vomiting, rash, itching, etc), please call us [phone] to see if we need to switch you to a different pain medicine that will work better for you and/or control your side effect better. If you need a refill on your pain medication, please contact your pharmacy.  They will contact our office to request authorization. Prescriptions will not be filled after 5 pm or on week-ends.  If can take up to 48 hours for it to be filled & ready so avoid waiting until you are down to thel ast pill.  A topical cream (Dibucaine) or a prescription for a cream (such as diltiazem 2% gel) may be given to you.  Many people find relief with topical creams.  Some people find it burns too much.  Experiment.  If it helps, use it.  If it burns, don't  using it.  You also may receive a prescription for diazepam, a muscle relaxant to help you to be able to urinate and defecate more easily.  It is safe to take a few doses with the other medications as long as you are not planning to drive or do anything intense.  Hopefully this can minimize the chance of needing a Foley catheter into your bladder     KEEP YOUR BOWELS  REGULAR The goal is one soft bowel movement a day Avoid getting constipated.  Between the surgery and the pain medications, it is common to experience some constipation.  Increasing fluid intake and taking a fiber supplement (such as Metamucil, Citrucel, FiberCon, MiraLax, etc) 2-4 times a day regularly will usually help prevent this problem from occurring.  A mild laxative (prune juice, Milk of Magnesia, MiraLax, etc) should be taken according to package directions if there are no bowel movements after 48 hours. Watch out for diarrhea.  If you have many loose bowel movements, simplify your diet to bland foods & liquids for a few days.  Stop any stool softeners and decrease your fiber supplement.  Switching to mild anti-diarrheal medications (Kayopectate, Pepto Bismol) can help.  Can try an imodium/loperamide dose.  If this worsens or does not improve, please call us.  Wound Care   a. You have some fluffed gauze on top of the anus to help catch drainage and bleeding.  Let the gauze fall off with the first bowel movement or shower.  It is okay to reinforce or replace as needed.  Bleeding is common at first and occasionally tapers off   b. Place soft cotton balls on the anus/wounds and use an absorbent pad in your underwear as needed to catch any drainage and help keep the area.  Try to use cotton over regular gauze as Kolls can stick and pull, causing pain.  Cotton will come off more easily.   c. Keep the area clean and dry.  Bathe / shower every day.  Keep the area clean by showering / bathing over the incision / wound.   It is okay to soak an open wound to help wash it.  Consider using a squeeze bottle filled with warm water to gently wash the anal area.  Wet wipes or showers / gentle washing after bowel movements is often less traumatic than regular toilet paper.  Use a Sitz Bath 4-8 times a day for relief  A sitz bath is a warm water bath taken in the sitting position that covers only the hips and  buttocks. It may be used for either healing or hygiene purposes. Sitz baths are also used to relieve pain, itching, or muscle spasms.  Gently cleaned the area and the heat will help lower spasm and offer better pain control.    Fill the bathtub half full with warm water. Sit in the water and open the drain a little. Turn on the warm water to keep the tub half full. Keep the water running constantly. Soak in the water for 15 to 20 minutes. After the sitz bath, pat the affected area dry first.   d. You will often notice bleeding, especially with bowel movements.  This should slow down by the end of the first week of surgery.  Sitting on an ice pack can help.   e. Expect some drainage.  You often will have some blood or yellow drainage with open wounds.  Sometimes she will get a little leaking of liquid stool until the  incision/wounds have fully close down.  This should slow down by the end of the first week of surgery, but you will have occasional bleeding or drainage up to a few months after surgery.  Wear an absorbent pad or soft cotton gauze in your underwear until the drainage stops.  ACTIVITIES as tolerated:    You may resume regular (light) daily activities beginning the next day--such as daily self-care, walking, climbing stairs--gradually increasing activities as tolerated.  If you can walk 30 minutes without difficulty, it is safe to try more intense activity such as jogging, treadmill, bicycling, low-impact aerobics, swimming, etc. Save the most intensive and strenuous activity for last such as sit-ups, heavy lifting, contact sports, etc  Refrain from any heavy lifting or straining until you are off narcotics for pain control.   DO NOT PUSH THROUGH PAIN.  Let pain be your guide: If it hurts to do something, don't do it.  Pain is your body warning you to avoid that activity for another week until the pain goes down. You may drive when you are no longer taking prescription pain medication, you  can comfortably sit for long periods of time, and you can safely maneuver your car and apply brakes. You may have sexual intercourse when it is comfortable.   FOLLOW UP in our office Please call [facility] at [phone] to set up an appointment to see your surgeon in the office for a follow-up appointment approximately 2-3 weeks after your surgery. Make sure that you call for this appointment the day you arrive home to ensure a convenient appointment time.  8. IF YOU HAVE DISABILITY OR FAMILY LEAVE FORMS, BRING THEM TO THE OFFICE FOR PROCESSING.  DO NOT GIVE THEM TO YOUR DOCTOR.        WHEN TO CALL us [phone]: Poor pain control Reactions / problems with new medications (rash/itching, nausea, etc)  Fever over 101.5 F (38.5 C) Inability to urinate Nausea and/or vomiting Worsening swelling or bruising Continued bleeding from incision. Increased pain, redness, or drainage from the incision  The clinic staff is available to answer your questions during regular business hours (8:30am-5pm).  Please dont hesitate to call and ask to speak to one of our nurses for clinical concerns.   A surgeon from [facility] is always on call at the hospitals   If you have a medical emergency, go to the nearest emergency room or call 911.    [facility], [address], [place], [place], [place]  [address] ? MAIN: [phone] ? TOLL FREE: [phone] ? FAX [phone] [email or web address]  #####################################################

## 2021-01-20 NOTE — Interval H&P Note (Signed)
History and Physical Interval Note:  01/20/2021 11:09 AM  Laura Wright  has presented today for surgery, with the diagnosis of RECURRENT RECTAL POLYP.  The various methods of treatment have been discussed with the patient and family. After consideration of risks, benefits and other options for treatment, the patient has consented to  Procedure(s): PARTIAL PROCTECTOMY BY TEM (Laura Wright) as a surgical intervention.  The patient's history has been reviewed, patient examined, no change in status, stable for surgery.  I have reviewed the patient's chart and labs.  Questions were answered to the patient's satisfaction.    I have re-reviewed the the patient's records, history, medications, and allergies.  I have re-examined the patient.  I again discussed intraoperative plans and goals of post-operative recovery.  The patient agrees to proceed.  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   Blood in the urine 10/12/2013   Congenital nystagmus 10/12/2013   Dermatophytic onychia 10/12/2013   Essential (primary) hypertension 10/12/2013   External hemorrhoids without complication 09/32/3557   Hypoactive sexual desire 10/12/2013   Ingrowing nail 10/12/2013   LBP (low back pain) 10/12/2013   Nasal vestibulitis 10/12/2013   Migraine without aura and responsive to treatment 10/12/2013   Excess weight 10/12/2013   Pain in the wrist 10/12/2013   Pain in rectum 10/12/2013   Bleeding per rectum 10/12/2013   Atheroma, skin 10/12/2013   Hemorrhoids, residual tags  10/12/2013   Menopausal symptom 10/12/2013   Buzzing in ear 10/12/2013   Fothergill's neuralgia 10/12/2013   Knee osteoarthritis 01/01/2013   Complex tear of medial meniscus of right knee as current injury 12/30/2011   Localized osteoarthritis of right knee 12/30/2011    Past Medical History:  Diagnosis Date   Anxiety    Complex tear of medial meniscus of right knee as current injury 12/30/2011   COVID    had in Oct 2020, had h/a and runny nose for 1 week, lost taste and smell for several weeks. all s/s resolved   Dental crowns present    x 2   Family history of adverse reaction to anesthesia    daughter has PONV   Headache(784.0)    due to trigeminal neuralgia and trauma of surgery   Herpes simplex type 1 infection    Hypertension    dx'ed in last six months   Knee dislocation 12/2011   right   Localized osteoarthritis of right knee 12/30/2011   Primary localized osteoarthritis of right hip 02/13/2018   Trigeminal neuralgia    Wears glasses    as stated on July 20, 2020    Past Surgical History:  Procedure Laterality Date   ABDOMINAL HYSTERECTOMY  1986   partial   COLONOSCOPY     cyst removed right hand     ganglion x 2   EVALUATION UNDER ANESTHESIA WITH HEMORRHOIDECTOMY Laura Wright 07/21/2020   Procedure: EXAM UNDER ANESTHESIA WITH HEMORRHOIDECTOMY WITH LIGATION AND HEMORRHOIDOPEXY; REMOVAL OF PROLAPSING RECTAL TISSUE;  Surgeon: Michael Boston, MD;  Location: [place];  Service: General;  Laterality: Laura Wright;   KNEE ARTHROSCOPY WITH MEDIAL MENISECTOMY  12/30/2011  Procedure: KNEE ARTHROSCOPY WITH MEDIAL MENISECTOMY;  Surgeon: Johnny Bridge, MD;  Location: [place];  Service: Orthopedics;  Laterality: Right;  RIGHT KNEE SCOPE Partial MEDIAL MENISCECTOMY   PARTIAL KNEE ARTHROPLASTY Right 01/01/2013   Procedure: UNICOMPARTMENTAL KNEE;  Surgeon: Johnny Bridge, MD;  Location: [place];  Service: Orthopedics;  Laterality: Right;   TOTAL HIP  ARTHROPLASTY Right 02/13/2018   Procedure: TOTAL HIP ARTHROPLASTY;  Surgeon: Marchia Bond, MD;  Location: [facility];  Service: Orthopedics;  Laterality: Right;   TRIGEMINAL NERVE DECOMPRESSION  04/2004   TUBAL LIGATION      Social History   Socioeconomic History   Marital status: Married    Spouse name: Not on file   Number of children: Not on file   Years of education: Not on file   Highest education level: Not on file  Occupational History   Not on file  Tobacco Use   Smoking status: Never   Smokeless tobacco: Never  Vaping Use   Vaping Use: Never used  Substance and Sexual Activity   Alcohol use: No   Drug use: No   Sexual activity: Not Currently  Other Topics Concern   Not on file  Social History Narrative   Not on file   Social Determinants of Health   Financial Resource Strain: Not on file  Food Insecurity: Not on file  Transportation Needs: Not on file  Physical Activity: Not on file  Stress: Not on file  Social Connections: Not on file  Intimate Partner Violence: Not on file    Family History  Problem Relation Age of Onset   Anesthesia problems Daughter        post-op N/V    Medications Prior to Admission  Medication Sig Dispense Refill Last Dose   Calcium-Magnesium-Vitamin D (CALCIUM 1200+D3 PO) Take 1 tablet by mouth daily.   01/13/2021   diclofenac (VOLTAREN) 50 MG EC tablet Take 50 mg by mouth daily as needed for moderate pain.   Past Week   glucosamine-chondroitin 500-400 MG tablet Take 1 tablet by mouth in the morning and at bedtime.   01/13/2021   LORazepam (ATIVAN) 0.5 MG tablet Take 0.5 mg by mouth daily as needed for anxiety.   01/20/2021 at 0900   losartan-hydrochlorothiazide (HYZAAR) 50-12.5 MG tablet Take 1 tablet by mouth daily.   01/19/2021   Multiple Vitamin (MULTIVITAMIN) tablet Take 1 tablet by mouth daily.   01/13/2021   Omega-3 1000 MG CAPS Take 1,000 mg by mouth in the morning and at bedtime.   01/13/2021   rosuvastatin (CRESTOR) 10 MG  tablet Take 10 mg by mouth daily.   01/20/2021 at 0900   valACYclovir (VALTREX) 1000 MG tablet Take 500 mg by mouth daily.   01/19/2021   meclizine (ANTIVERT) 25 MG tablet Take 1 tablet (25 mg total) by mouth 3 (three) times daily as needed for dizziness. 30 tablet 0 More than a month   ondansetron (ZOFRAN) 4 MG tablet Take 1 tablet (4 mg total) by mouth every 8 (eight) hours as needed for nausea or vomiting. (Patient not taking: Reported on 12/30/2020) 10 tablet 0 Not Taking   oxyCODONE (OXY IR/ROXICODONE) 5 MG immediate release tablet Take 1-2 tablets (5-10 mg total) by mouth every 6 (six) hours as needed for moderate pain, severe pain or breakthrough pain. (Patient not taking: Reported on 12/30/2020) 20 tablet 0 Not Taking    Current Facility-Administered Medications  Medication Dose Route Frequency Provider Last Rate Last Admin   bupivacaine  liposome (EXPAREL) 1.3 % injection 266 mg  20 mL Infiltration Once Michael Boston, MD       cefoTEtan (CEFOTAN) 2 g in sodium chloride 0.9 % 100 mL IVPB  2 g Intravenous On Call to OR Michael Boston, MD       Chlorhexidine Gluconate Cloth 2 % PADS 6 each  6 each Topical Once Michael Boston, MD       And   Chlorhexidine Gluconate Cloth 2 % PADS 6 each  6 each Topical Once Michael Boston, MD       lactated ringers infusion   Intravenous Continuous Freddrick March, MD 10 mL/hr at 01/20/21 1055 New Bag at 01/20/21 1055     Allergies  Allergen Reactions   Macrodantin [Nitrofurantoin Macrocrystal] Itching and Rash    BP (!) 156/106    Pulse 93    Temp 98.6 F (37 C) (Oral)    Resp 18    Ht 5' 4.5" (1.638 m)    Wt 79.7 kg    SpO2 100%    BMI 29.68 kg/m   Labs: No results found for this or any previous visit (from the past 48 hour(s)).  Imaging / Studies: No results found.   Adin Hector, M.D., F.A.C.S. Gastrointestinal and Minimally Invasive Surgery [facility] [address], [place], [place] [address] [phone] Main / Paging  01/20/2021 11:09 AM    Adin Hector

## 2021-01-20 NOTE — H&P (Signed)
01/20/2021    PROVIDER: Hollace Kinnier, MD  Patient Care Team: Bobbye Charleston, DO as PCP - General (Internal Medicine) Johney Maine, Adrian Saran, MD as Consulting Provider (General Surgery) Magod, Glade Stanford, MD (Gastroenterology) Marchia Bond, MD (Orthopedic Surgery)  [facility] MRN: [id number] DOB: 08-Nov-1950 DATE OF ENCOUNTER: 12/07/2020  Interval History:   The patient returns to the office after undergoing transanal excision of prolapsing rectal mass with hemorrhoidectomy and hemorrhoidal ligation & pexy. 07/21/2020  Patient returns after surgery. She denies any bleeding. She stopped Benefiber and goes about twice a day now. She is occasionally notes a little bit of stool when she urinates and wipes but no major leakage. Feels a small hemorrhoid on the right front side that is soft and not really irritating. She is not noticed any bleeding    Labs, Imaging and Diagnostic Testing:  Located in [place]' section of Epic EMR chart  PRIOR NOTES   Located in [place]' section of Epic EMR chart  SURGERY NOTES:  07/21/2020   3:34 PM   PATIENT: Laura Wright 71 y.o. female   Patient Care Team: Francesca Oman, DO as PCP - General (Internal Medicine) Michael Boston, MD as Consulting Physician (General Surgery) Marchia Bond, MD as Consulting Physician (Orthopedic Surgery)   PRE-OPERATIVE DIAGNOSIS:  PROLAPSED RECTAL MASS HEMORRHOIDS GRADE 3 & 4   POST-OPERATIVE DIAGNOSIS:  PROLAPSED RECTAL MASS HEMORRHOIDS GRADE 3 & 4   PROCEDURE:  TRANSANAL EXCISION OF PROLAPSING RECTAL MASS HEMORRHOIDECTOMY  HEMORRHOID LIGATION AND HEMORRHOIDOPEXY ANORECTAL EXAM UNDER ANESTHESIA    SURGEON: Adin Hector, MD   ANESTHESIA:    General Anorectal & Local field block (0.25% bupivacaine with epinephrine mixed with Liposomal bupivacaine (Experel)    EBL: Total I/O In: 200 [I.V.:100; IV Piggyback:100] Out: - . See operative record   Delay start of  Pharmacological VTE agent (>24hrs) due to surgical blood loss or risk of bleeding: NO   DRAINS: NONE   SPECIMEN:  Right posterior right posterior prolapsing rectal wall mass. Probable adenomatous polyp with en bloc right posterior hemorrhoidectomy   Right anterior hemorrhoid   DISPOSITION OF SPECIMEN: PATHOLOGY   COUNTS: YES   PLAN OF CARE: Discharge home after PACU   PATIENT DISPOSITION: PACU - hemodynamically stable.   INDICATION: Pleasant patient with struggles with hemorrhoids. Underwent endoscopic banding x2 but then developed worsening swelling and prolapse. Underwent colonoscopy with bleeding. No major proximal lesion but polyp related prolapsing mass noted. Biopsy concern for adenomatous tissue. Chronically prolapse with incontinence. Frustrating. Not able to be managed in the office despite an improved bowel regimen. I recommended examination under anesthesia and surgical treatment:   The anatomy & physiology of the anorectal region was discussed. The pathophysiology of hemorrhoids and differential diagnosis was discussed. Natural history risks without surgery was discussed. I stressed the importance of a bowel regimen to have daily soft bowel movements to minimize progression of disease. Interventions such as sclerotherapy & banding were discussed.   The patient's symptoms are not adequately controlled by medicines and other non-operative treatments. I feel the risks & problems of no surgery outweigh the operative risks; therefore, I recommended surgery to treat the hemorrhoids by ligation, pexy, and possible resection.   Risks such as bleeding, infection, need for further treatment, heart attack, death, and other risks were discussed. I noted a good likelihood this will help address the problem. Goals of post-operative recovery were discussed as well. Possibility that this will not correct all symptoms was explained. Post-operative  pain, bleeding, constipation, urinary difficulties,  and other problems after surgery were discussed. We will work to minimize complications. Educational handouts further explaining the pathology, treatment options, and bowel regimen were given as well. Questions were answered. The patient expresses understanding & wishes to proceed with surgery.   OR FINDINGS: Moderately large pedunculated right posterior rectal polypoid mass. Suspicious for adenoma. Just proximal to a great 3/4 right posterior hemorrhoid. Excision done.  Right anterior grade 3 prolapsing hemorrhoid. Ligation pexy and hemorrhoidectomy done. Left lateral grade 2.  No other rectal masses. No true circumferential procidentia prolapse. Decreased anal sphincter tone overall. No fissure. No fistulas. No pilonidal disease.   DESCRIPTION:    Informed consent was confirmed. Patient underwent general anesthesia without difficulty. Patient was placed into prone positioning. The perianal region was prepped and draped in sterile fashion. Surgical time-out confirmed our plan.   I did digital rectal examination and then transitioned over to anoscopy to get a sense of the anatomy. Findings noted above.    I focused on the large prolapsing rectal mass. It was coming off the right posterior rectum about 3 cm from the anal verge, just proximal to an inflamed enlarged right posterior hemorrhoid as well. I placed a 2-0 Vicryl suture on a UR 6 needle in a figure-of-eight fashion proximally and ran that kidney running ERAS with locking fashion. I then excised the right posterior hemorrhoid and large polyp over the sphincters and rectal wall. Near full-thickness excision on the polyp fluid mass. This involved about quarter of the rectal wall circumference. I closed the wound longitudinally with a running 2-0 interrupted Vicryl suture as first and then switched over to horizontal interrupted running suture from left posterior to right posterior to close the defect from the excision. Did that over a Parks  retractor to avoid any narrowing.  Then focused on the rest of the piles. I proceeded to do hemorrhoidal ligation and pexy. I used a 2-0 Vicryl suture on a UR-6 needle in a figure-of-eight fashion 6 cm proximal to the anal verge. I started at the largest hemorrhoid pile remaining, right anterior. Because of redundant hemorrhoidal tissue too bulky to merely ligate or pexy, I excised the excess internal hemorrhoid piles longitudinally in a fusiform biconcave fashion, sparing the anal canal to avoid narrowing. I then ran that stitch longitudinally more distally to close the hemorrhoidectomy wound to the anal verge over a large Parks self-retaining anal retractor to avoid narrowing of the anal canal. I then tied that stitch down to cause a hemorrhoidopexy. I then did hemorrhoidal ligation and pexy at the other 4 hemorrhoidal columns. At the completion of this, all 6 anorectal columns were ligated and pexied in the classic hexagonal fashion (right anterior/lateral/posterior, left anterior/lateral/posterior).    I then excised redundant right anterior internal and external hemorrhoidal tissue that was quite redundant. I closed this external hemorrhoidectomy wounds with radial interrupted horizontal mattress suture. Started with 2-0 Vicryl at the anal verge and then 2-0 chromic suture over a large Sawyer anal retractor, leaving the last 5 mm open to allow natural drainage.    I redid anoscopy & examination. At completion of this, all hemorrhoids had been removed or reduced into the rectum. There is no more prolapse. Internal & external anatomy was more more normal. Hemostasis was good. Fluffed gauze was on-laid over the perianal region. BNO suppository placed and strict IV fluid restrictions in the hopes of minimizing urinary retention. Patient is being extubated go to go to the recovery room.  I had discussed postop care in detail with the patient in the preop holding area. Instructions for post-operative  recovery and prescriptions are written. I discussed operative findings, updated the patient's status, discussed probable steps to recovery, and gave postoperative recommendations to the patient's spouse. Recommendations were made. Questions were answered. He expressed understanding & appreciation.   Adin Hector, M.D., F.A.C.S. Gastrointestinal and Minimally Invasive Surgery [facility] [address], [place], [place] [address] [phone] Main / Paging    PATHOLOGY:  SURGICAL PATHOLOGY  CASE: [phone]  PATIENT: Gilman Buttner  Surgical Pathology Report   Clinical History: Hemorrhoids Grade 4 (crm)   FINAL MICROSCOPIC DIAGNOSIS:   A. RIGHT POSTERIOR PROLAPSING RECTAL MASS, EXCISION:  - Tubulovillous adenoma  - No high-grade dysplasia or carcinoma.  - Adenoma focally involves the mucosal margin.   B. HEMORRHOID, RIGHT ANTERIOR, HEMORRHOIDECTOMY:  - Hemorrhoid.  - No dysplasia or carcinoma.   [facility] Surgeon DESCRIPTION:   Specimen A: Received in formalin is a 4.1 x 2.8 x 1.1 cm tan-pink to  pink-red polypoid tissue which has disrupted edges.  The presumed base  of the specimen is inked black.  Also in the same container are few additional pink-red polypoid tissues,  2.3 x 2 x 0.8 cm in aggregate.  Block summary:  Blocks 1-4 sections of polypoid tissue including presumed inked base  Block 5 = representative sections of additional polypoid tissues   Specimen B: Received in formalin are 4 irregular pieces of soft to  rubbery tissue, each having areas with overlying pink-white to hyperemic  wrinkled skin.  The tissues range from 1.1 x 0.7 x 0.5 cm to 1.4 x 1.1 x  0.4 cm.  1 piece is bisected.  Entirely submitted 1 block.   SW 07/21/2020   Final Diagnosis performed by Claudette Laws, MD.   Electronically signed  07/22/2020  Technical component performed at [facility], [place]  [address], [place], [place] [address].    Professional component performed at [employer]. [facility],  [place] [address] [place], [place] [address].   Immunohistochemistry Technical component (if applicable) was performed  at [facility]. [address], [place],  [place], [place] [address].   IMMUNOHISTOCHEMISTRY DISCLAIMER (if applicable):  Some of these immunohistochemical stains may have been developed and the  performance characteristics determine by [facility]. Some  may not have been cleared or approved by the U.S. Food and Drug  Administration. The FDA has determined that such clearance or approval  is not necessary. This test is used for clinical purposes. It should not  be regarded as investigational or for research. This laboratory is  certified under the [place]  (CLIA-88) as qualified to perform high complexity clinical laboratory  testing.  The controls stained appropriately.   Physical Examination:   Constitutional: Not cachectic. Hygeine adequate. Vitals signs as above.  Eyes: Normal extraocular movements. Sclera nonicteric Neuro: No major focal sensory defects. No major motor deficits. Psych: No severe agitation. No severe anxiety. Judgment & insight Adequate, Oriented x4, HENT: Normocephalic, Mucus membranes moist. No thrush.  Neck: Supple, No tracheal deviation. No obvious thyromegaly Chest: No pain to chest wall compression. Good respiratory excursion. No audible wheezing CV: Regular rhythm. No major extremity edema Abdomen: Flat Hernia: Not present. Diastasis recti: Not present. Soft. Nondistended. Nontender.  Gen: Inguinal hernia: Not present. Inguinal lymph nodes: without lymphadenopathy.  Skin: Warm and dry Musculoskeletal: Severe joint rigidity  not present.   RECTAL: ##################################  Perianal skin Clean with good hygiene  Pruritis ani: Not present Anal fissure: Not present Perirectal abscess/fistula  Not present External hemorrhoids Present at: right anterior 7x45mm soft Sphincter tone Normal   Patient tolerates anoscopy and digital exam. I can palpate an elevated polypoid 25 x 10 mm arc of tissue most likely at the site of the excision along the right posterior distal rectum about 3 cm from the anal verge. On anoscopy looks a little atypical. Suspicious for local recurrence. Left lateral and anterior otherwise underwhelming  Other significant findings: N/A  Patient examined with assistance of female Medical Assistant in the room with patient in decubitus position .  ###################################  Assessment and Plan:   An Lannan is a 71 y.o. female recovering s/p transanal excision of large adenomatous polyp. No high-grade dysplasia or cancer. Also hemorrhoidal ligation & pexy.  Diagnoses and all orders for this visit:  Mass in rectum  History of adenomatous polyp of colon  External hemorrhoids without complication    Recovering status post removal of prolapsing rectal mass consistent with adenomatous polyp. No high-grade dysplasia. Margins questionable but grossly seen negative.  Unfortunately on 3-month follow-up I can palpate abnormality at the old margin that raises suspicion of recurrent rectal tissue. Seems more adenomatous.   I recommended outpatient surgical resection. I would use a TEM system to allow better visualization and margins and then most likely a transanal closure. She does have a small right anterior external hemorrhoid I can try and trim down but most likely will try and avoid being overly aggressive. She does not love the idea of a second operation but agrees in being aggressive to get this under better control to get better margins. She was tearful about this but ultimately consolable. We will work to coordinate a convenient time  The anatomy & physiology of the digestive tract was discussed. The pathophysiology of the rectal pathology was  discussed. Natural history risks without surgery was discussed. I feel the risks of no intervention will lead to serious problems that outweigh the operative risks; therefore, I recommended surgery.   Laparoscopic & open abdominal techniques were discussed. I recommended we start with a partial proctectomy by transanal endoscopic microsurgery (TEM) vs transanal minimally invasive surgery (TAMIS) for excisional biopsy to remove the pathology and hopefully cure and/or control the pathology. This technique can offer less operative risk and faster post-operative recovery. Possible need for immediate or later abdominal surgery for further treatment was discussed.   Risks such as bleeding, abscess, reoperation, ostomy, heart attack, death, and other risks were discussed. I noted a good likelihood this will help address the problem. Goals of post-operative recovery were discussed as well. We will work to minimize complications. An educational handout was given as well. Questions were answered. The patient expresses understanding & wishes to proceed with surgery.  Adin Hector, MD, FACS, MASCRS Esophageal, Gastrointestinal & Colorectal Surgery Robotic and Minimally Invasive Surgery  [facility], [place]. [address], [place], [place] [address] [phone] Fax [phone] Main  CONTACT INFORMATION:  Weekday (9AM-5PM): Call [facility] main office at [phone]  Weeknight (5PM-9AM) or Weekend/Holiday: Check [email or web address] (password " [email or web address]") for General Surgery [facility] coverage  (Please, do not use SecureChat as it is not reliable communication to operating surgeons for immediate patient care)      01/20/2021

## 2021-01-20 NOTE — Transfer of Care (Signed)
Immediate Anesthesia Transfer of Care Note  Patient: Laura Wright  Procedure(s) Performed: PARTIAL PROCTECTOMY BY TEM (Rectum) HEMORRHOIDECTOMY (Anus)  Patient Location: PACU  Anesthesia Type:General  Level of Consciousness: awake, alert  and oriented  Airway & Oxygen Therapy: Patient Spontanous Breathing and Patient connected to face mask  Post-op Assessment: Report given to RN and Post -op Vital signs reviewed and stable  Post vital signs: Reviewed and stable  Last Vitals:  Vitals Value Taken Time  BP 105/68 01/20/21 1422  Temp    Pulse 84 01/20/21 1423  Resp 10 01/20/21 1423  SpO2 99 % 01/20/21 1423  Vitals shown include unvalidated device data.  Last Pain:  Vitals:   01/20/21 1045  TempSrc:   PainSc: 0-No pain         Complications: No notable events documented.

## 2021-01-20 NOTE — Op Note (Addendum)
01/20/2021  2:18 PM  PATIENT:  Laura Wright  71 y.o. female  Patient Care Team: Francesca Oman, DO as PCP - General (Internal Medicine) Michael Boston, MD as Consulting Physician (General Surgery) Marchia Bond, MD as Consulting Physician (Orthopedic Surgery)  PRE-OPERATIVE DIAGNOSIS:  RECURRENT RECTAL POLYP  POST-OPERATIVE DIAGNOSIS:  RECURRENT RECTAL POLYP  PROCEDURE:   PARTIAL PROCTECTOMY BY TEM HEMORRHOIDECTOMY  SURGEON:  Adin Hector, MD  ASSISTANT: OR Staff   ANESTHESIA:   local and general  EBL:  Total I/O In: 1000 [I.V.:1000] Out: 110 [Urine:100; Blood:10]  Delay start of Pharmacological VTE agent (>24hrs) due to surgical blood loss or risk of bleeding:  no  DRAINS: none   SPECIMENS:  Distal recurrent rectal polyp Right anterior lateral external hemorrhoid   (see OR findings below)  DISPOSITION OF SPECIMEN:  PATHOLOGY  COUNTS:  YES  PLAN OF CARE: Admit for overnight observation  PATIENT DISPOSITION:  PACU - hemodynamically stable.  INDICATION: Pleasant woman found to have prolapsing anorectal mass.  Initially presumed to be hemorrhoid.  Did transanal excision found to have adenomatous polyp.  Margins grossly negative but microscopically very close.  Recommended close follow-up.  Found to have evidence of recurrence several months later on exam.  I recommended transanal excision using TEM system for better margins.  The anatomy & physiology of the digestive tract was discussed.  The pathophysiology of the rectal pathology was discussed.  Natural history risks without surgery was discussed.   I feel the risks of no intervention will lead to serious problems that outweigh the operative risks; therefore, I recommended surgery.    Laparoscopic & open abdominal techniques were discussed.  I recommended we start with a partial proctectomy by transanal endoscopic microsurgery (TEM) for excisional biopsy to remove the pathology and hopefully cure and/or  control the pathology.  This technique can offer less operative risk and faster post-operative recovery.  Possible need for immediate or later abdominal surgery for further treatment was discussed.   Risks such as bleeding, abscess, reoperation, ostomy, heart attack, death, and other risks were discussed.   I noted a good likelihood this will help address the problem.  Goals of post-operative recovery were discussed as well.  We will work to minimize complications.  An educational handout was given as well.  Questions were answered.  The patient expresses understanding & wishes to proceed with surgery.  OR FINDINGS: 2 polyps 1-2 cm from the anal verge in the posterior rectum.  Some fibrosis on the old scar but mostly soft.  No strong evidence of deep invasion grossly.  The resulting mass was 5x5 cm in size  Pin placement on pathology specimen: Proximal margin: Pink Distal margin:Green Right lateral: White Left lateral:  Black  The closure rests at the level of the sphincters 1 cm from the anal verge in the posterior location.  It is 40% of the circumference.  Patient with some narrowing in the distal rectum at the distal rectal  valve now within 4 cm of the anal verge posteriorly.  Persistent right anterior external hemorrhoid.  Patient requested that it be removed as well since she has some irritation there.  Simple excision and closure done  DESCRIPTION: Informed consent was confirmed.  Patient received general anesthesia without difficulty.  Foley catheter sterilely placed.  Sequential compression devices active during the entire case.  The patient was placed in the low lithotomyposition, taking extra care to secure and protect the patient appropriately.  The perineum and perianal regions  were prepped and draped in a sterile fashion.  Surgical timeout confirmed our plan.  I did a gentle digital rectal examination with gradual anal dilation to allow placement of the 4 cm TEO Stortz scope.  This  was secured to the bed using the TEO clamping system.  We induced carbon dioxide insufflation intraluminally.  I oriented the Stortz scope and the patient such that the specimen rested towards the floor at the 6:00 position.  I could easily identify the mass.  I went ahead and used tip point cautery to mark 1 cm margins circumferentially.  I then did a full thickness transection at the distal margin.  I came around laterally.  Switched over to harmonic dissection.  Lifted the specimen off the pelvic canal for a good deep margin.  I gradually came more proximally.  Transected at the proximal margin.  I ensured hemostasis.  I inspected the main specimen and pinned it on thick cork board.  Pins as noted above.  I walked the specimen down to pathology and showed the [facility] pathology team for proper orientation.    I went back in and scrubbed in.  Hemostasis was good.  I reapproximated the wound transversely with a 2-0 Vicryl horizontal mattress stitches & large Hill-Ferguson retractor.  I started at each corner.  I then did a another stitch centrally to bring the middle part of the rectum down to help close the middle of the wound.  I then continued intervening horizontal mattress transverse sutures.  This helped suture the enlarged right posterior and left lateral hemorrhoids down as well.  I did meticulous inspection with fine tip instruments to confirm good watertight closure   Hemostasis excellent.  I excised her right anterior external hemorrhoid per her request radially & closed the wound radially with 2-0 chromic horizontal mattress suture over a large Sawyer anal retractor. The lumen was quite patent.  No anal stricturing.  No more prolapsing hemorrhoids.  The patient is being extubated go to recovery room.  I discussed operative findings, updated the patient's status, discussed probable steps to recovery, and gave postoperative recommendations to the patient's spouse, Manasvi Dickard.  Recommendations were  made.  Questions were answered.  He expressed understanding & appreciation.   Adin Hector, MD, FACS, MASCRS Gastrointestinal and Minimally Invasive Surgery    [address], [place], [place] [address] [phone] Main / Paging [phone] Fax

## 2021-01-21 ENCOUNTER — Encounter (HOSPITAL_COMMUNITY): Payer: Self-pay | Admitting: Surgery

## 2021-01-21 NOTE — Anesthesia Postprocedure Evaluation (Signed)
Anesthesia Post Note  Patient: Laura Wright  Procedure(s) Performed: PARTIAL PROCTECTOMY BY TEM (Rectum) HEMORRHOIDECTOMY (Anus)     Patient location during evaluation: PACU Anesthesia Type: General Level of consciousness: awake and alert Pain management: pain level controlled Vital Signs Assessment: post-procedure vital signs reviewed and stable Respiratory status: spontaneous breathing, nonlabored ventilation, respiratory function stable and patient connected to nasal cannula oxygen Cardiovascular status: blood pressure returned to baseline and stable Postop Assessment: no apparent nausea or vomiting Anesthetic complications: no   No notable events documented.  Last Vitals:  Vitals:   01/20/21 1803 01/20/21 1922  BP:  118/78  Pulse: 99 (!) 104  Resp:    Temp:  36.5 C  SpO2:  98%    Last Pain:  Vitals:   01/20/21 1922  TempSrc:   PainSc: 3    Pain Goal:                   Barnet Glasgow

## 2021-01-22 LAB — SURGICAL PATHOLOGY

## 2021-06-07 LAB — HM DEXA SCAN

## 2021-06-07 LAB — HM MAMMOGRAPHY

## 2021-10-07 ENCOUNTER — Ambulatory Visit (INDEPENDENT_AMBULATORY_CARE_PROVIDER_SITE_OTHER): Payer: Medicare Other | Admitting: Internal Medicine

## 2021-10-07 ENCOUNTER — Encounter: Payer: Self-pay | Admitting: Internal Medicine

## 2021-10-07 VITALS — BP 134/82 | HR 97 | Temp 98.0°F | Resp 16 | Ht 64.5 in | Wt 165.0 lb

## 2021-10-07 DIAGNOSIS — I10 Essential (primary) hypertension: Secondary | ICD-10-CM

## 2021-10-07 DIAGNOSIS — Z23 Encounter for immunization: Secondary | ICD-10-CM

## 2021-10-07 DIAGNOSIS — E782 Mixed hyperlipidemia: Secondary | ICD-10-CM

## 2021-10-07 DIAGNOSIS — N951 Menopausal and female climacteric states: Secondary | ICD-10-CM

## 2021-10-07 DIAGNOSIS — B009 Herpesviral infection, unspecified: Secondary | ICD-10-CM

## 2021-10-07 MED ORDER — VENLAFAXINE HCL ER 37.5 MG PO CP24: 37.5000 mg | ORAL_CAPSULE | Freq: Every day | ORAL | 1 refills | Status: DC

## 2021-10-07 MED ORDER — ROSUVASTATIN CALCIUM 10 MG PO TABS: 10.0000 mg | ORAL_TABLET | Freq: Every day | ORAL | 1 refills | Status: DC

## 2021-10-07 MED ORDER — LOSARTAN POTASSIUM-HCTZ 50-12.5 MG PO TABS: 1.0000 | ORAL_TABLET | Freq: Every day | ORAL | 1 refills | Status: DC

## 2021-10-07 MED ORDER — VALACYCLOVIR HCL 1 G PO TABS: 500.0000 mg | ORAL_TABLET | Freq: Every day | ORAL | 1 refills | Status: DC

## 2021-10-07 NOTE — Patient Instructions (Signed)
It was great seeing you today!  Plan discussed at today's visit: -Medications refilled today -Plan for fasting labs  next time   Follow up in: 6 months   Take care and let us know if you have any questions or concerns prior to your next visit.  Dr. Rosana Berger

## 2021-10-07 NOTE — Progress Notes (Signed)
New Patient Office Visit  Subjective    Patient ID: Laura Wright, female    DOB: 1951/01/04  Age: 71 y.o. MRN: [id number]  CC:  Chief Complaint  Patient presents with   Establish Care    HPI [name] presents to establish care.  She had a PCP through [facility], however her PCP is leaving and the patient lives in [place].  Hypertension: -Medications: Losartan-HCTZ 50-12.5 mg -has been stable on this medication for the last 4 to 5 years -Patient is compliant with above medications and reports no side effects. -Checking BP at home (average): Does not check -Denies any SOB, CP, vision changes, LE edema or symptoms of hypotension  HLD: -Medications: Crestor 10 mg -Patient is compliant with above medications and reports no side effects.  -Last lipid panel: 3/23 TC 139, triglycerides 186, HDL 50, LDL 89  VMS/Hot Flashes: -Currently on Effexor 37.5 mg - controlling hot flash symptoms  -History of hysterectomy in 1986 secondary to uterine fibroids, has not had any vaginal bleeding since that time  Hx of recurrent oral HSV: -Currently Valtrex 500 mg daily, has not had a flare in several years  Health Maintenance: -Blood work up to date -reviewed labs obtained by [facility] on 03/19/2021. -Mammogram/DEXA - obtaining records.  Has never had an abnormal mammogram, patient reports last DEXA scan showing osteopenia.  Patient is taking calcium and vitamin D supplements. -Colonoscopy - obtaining records.  Last colonoscopy was in the summer 2022, however patient reports she had multiple polyps and will be due for repeat colonoscopy relatively soon.  Outpatient Encounter Medications as of 10/07/2021  Medication Sig   Calcium-Magnesium-Vitamin D (CALCIUM 1200+D3 PO) Take 1 tablet by mouth daily.   diclofenac (VOLTAREN) 50 MG EC tablet Take 50 mg by mouth daily as needed for moderate pain.   glucosamine-chondroitin 500-400 MG tablet Take 1 tablet by mouth in the morning and  at bedtime.   LORazepam (ATIVAN) 0.5 MG tablet Take 0.5 mg by mouth daily as needed for anxiety.   losartan-hydrochlorothiazide (HYZAAR) 50-12.5 MG tablet Take 1 tablet by mouth daily.   meclizine (ANTIVERT) 25 MG tablet Take 1 tablet (25 mg total) by mouth 3 (three) times daily as needed for dizziness.   Multiple Vitamin (MULTIVITAMIN) tablet Take 1 tablet by mouth daily.   Omega-3 1000 MG CAPS Take 1,000 mg by mouth in the morning and at bedtime.   rosuvastatin (CRESTOR) 10 MG tablet Take 10 mg by mouth daily.   valACYclovir (VALTREX) 1000 MG tablet Take 500 mg by mouth daily.   venlafaxine XR (EFFEXOR-XR) 37.5 MG 24 hr capsule Take 37.5 mg by mouth daily.   [DISCONTINUED] ondansetron (ZOFRAN) 4 MG tablet Take 1 tablet (4 mg total) by mouth every 8 (eight) hours as needed for nausea or vomiting. (Patient not taking: Reported on 12/30/2020)   [DISCONTINUED] oxyCODONE-acetaminophen (PERCOCET) 5-325 MG tablet Take 1 tablet by mouth every 6 (six) hours as needed for severe pain.   No facility-administered encounter medications on file as of 10/07/2021.    Past Medical History:  Diagnosis Date   Anxiety    Cataract    Complex tear of medial meniscus of right knee as current injury 12/30/2011   COVID    had in Oct 2020, had h/a and runny nose for 1 week, lost taste and smell for several weeks. all s/s resolved   Dental crowns present    x 2   Family history of adverse reaction to anesthesia  daughter has PONV   Headache(784.0)    due to trigeminal neuralgia and trauma of surgery   Herpes simplex type 1 infection    Hyperlipidemia    Hypertension    dx'ed in last six months   Knee dislocation 12/2011   right   Localized osteoarthritis of right knee 12/30/2011   Osteoporosis    Primary localized osteoarthritis of right hip 02/13/2018   Trigeminal neuralgia    Wears glasses    as stated on July 20, 2020    Past Surgical History:  Procedure Laterality Date   ABDOMINAL  HYSTERECTOMY  1986   partial   COLONOSCOPY     cyst removed right hand     ganglion x 2   EVALUATION UNDER ANESTHESIA WITH HEMORRHOIDECTOMY N/A 07/21/2020   Procedure: EXAM UNDER ANESTHESIA WITH HEMORRHOIDECTOMY WITH LIGATION AND HEMORRHOIDOPEXY; REMOVAL OF PROLAPSING RECTAL TISSUE;  Surgeon: Michael Boston, MD;  Location: [place];  Service: General;  Laterality: N/A;   HEMORRHOID SURGERY  01/20/2021   Procedure: HEMORRHOIDECTOMY;  Surgeon: Michael Boston, MD;  Location: [facility];  Service: General;;   JOINT REPLACEMENT     KNEE ARTHROSCOPY WITH MEDIAL MENISECTOMY  12/30/2011   Procedure: KNEE ARTHROSCOPY WITH MEDIAL MENISECTOMY;  Surgeon: Johnny Bridge, MD;  Location: [place];  Service: Orthopedics;  Laterality: Right;  RIGHT KNEE SCOPE Partial MEDIAL MENISCECTOMY   PARTIAL KNEE ARTHROPLASTY Right 01/01/2013   Procedure: UNICOMPARTMENTAL KNEE;  Surgeon: Johnny Bridge, MD;  Location: [place];  Service: Orthopedics;  Laterality: Right;   PARTIAL PROCTECTOMY BY TEM N/A 01/20/2021   Procedure: PARTIAL PROCTECTOMY BY TEM;  Surgeon: Michael Boston, MD;  Location: [facility];  Service: General;  Laterality: N/A;   TOTAL HIP ARTHROPLASTY Right 02/13/2018   Procedure: TOTAL HIP ARTHROPLASTY;  Surgeon: Marchia Bond, MD;  Location: [facility];  Service: Orthopedics;  Laterality: Right;   TRIGEMINAL NERVE DECOMPRESSION  04/2004   TUBAL LIGATION      Family History  Problem Relation Age of Onset   Anesthesia problems Daughter        post-op N/V   Arthritis Mother    Asthma Mother    Heart disease Mother    Diabetes Father    Stroke Father     Social History   Socioeconomic History   Marital status: Married    Spouse name: Not on file   Number of children: Not on file   Years of education: Not on file   Highest education level: Not on file  Occupational History   Not on file  Tobacco Use   Smoking status: Never   Smokeless tobacco: Never  Vaping Use    Vaping Use: Never used  Substance and Sexual Activity   Alcohol use: No   Drug use: Never   Sexual activity: Yes    Birth control/protection: Surgical  Other Topics Concern   Not on file  Social History Narrative   Not on file   Social Determinants of Health   Financial Resource Strain: Not on file  Food Insecurity: Not on file  Transportation Needs: Not on file  Physical Activity: Not on file  Stress: Not on file  Social Connections: Not on file  Intimate Partner Violence: Not on file    Review of Systems  Constitutional:  Negative for chills and fever.  Respiratory:  Negative for shortness of breath.   Cardiovascular:  Negative for chest pain.  Neurological:  Negative for headaches.  Objective    BP 134/82   Pulse 97   Temp 98 F (36.7 C)   Resp 16   Ht 5' 4.5" (1.638 m)   Wt 165 lb (74.8 kg)   SpO2 98%   BMI 27.88 kg/m   Physical Exam Constitutional:      Appearance: Normal appearance.  HENT:     Head: Normocephalic and atraumatic.     Mouth/Throat:     Mouth: Mucous membranes are moist.     Pharynx: Oropharynx is clear.  Eyes:     Conjunctiva/sclera: Conjunctivae normal.  Cardiovascular:     Rate and Rhythm: Normal rate and regular rhythm.  Pulmonary:     Effort: Pulmonary effort is normal.     Breath sounds: Normal breath sounds.  Musculoskeletal:     Right lower leg: No edema.     Left lower leg: No edema.  Skin:    General: Skin is warm and dry.     Findings: Bruising present.  Neurological:     General: No focal deficit present.     Mental Status: She is alert. Mental status is at baseline.  Psychiatric:        Mood and Affect: Mood normal.        Behavior: Behavior normal.         Assessment & Plan:   1. Essential (primary) hypertension: Chronic and stable.  Continue losartan-HCTZ 50-12.5 mg daily, this medication was refilled today.  Follow-up in 6 months for annual labs.  - losartan-hydrochlorothiazide (HYZAAR) 50-12.5  MG tablet; Take 1 tablet by mouth daily.  Dispense: 90 tablet; Refill: 1  2. Mixed hyperlipidemia: Stable.  Continue Crestor 10 mg, refilled today.  - rosuvastatin (CRESTOR) 10 MG tablet; Take 1 tablet (10 mg total) by mouth daily.  Dispense: 90 tablet; Refill: 1  3. Vasomotor symptoms due to menopause: Well-controlled.  Continue Effexor 37.5 mg daily, refilled today.  - venlafaxine XR (EFFEXOR-XR) 37.5 MG 24 hr capsule; Take 1 capsule (37.5 mg total) by mouth daily.  Dispense: 90 capsule; Refill: 1  4. HSV-1 infection: Continue prophylactic dosing of 500 mg daily, refilled today.  - valACYclovir (VALTREX) 1000 MG tablet; Take 0.5 tablets (500 mg total) by mouth daily.  Dispense: 90 tablet; Refill: 1  5. Need for influenza vaccination: Flu vaccine administered today.  - Flu Vaccine QUAD High Dose(Fluad)  Return in about 6 months (around 04/08/2022).   Teodora Medici, DO

## 2021-10-08 ENCOUNTER — Encounter: Payer: Self-pay | Admitting: Internal Medicine

## 2022-03-07 ENCOUNTER — Encounter: Payer: Self-pay | Admitting: Internal Medicine

## 2022-03-09 DIAGNOSIS — M19012 Primary osteoarthritis, left shoulder: Secondary | ICD-10-CM | POA: Diagnosis not present

## 2022-03-15 ENCOUNTER — Encounter: Payer: Self-pay | Admitting: Internal Medicine

## 2022-03-22 DIAGNOSIS — L570 Actinic keratosis: Secondary | ICD-10-CM | POA: Diagnosis not present

## 2022-03-22 DIAGNOSIS — C44319 Basal cell carcinoma of skin of other parts of face: Secondary | ICD-10-CM | POA: Diagnosis not present

## 2022-03-22 DIAGNOSIS — D485 Neoplasm of uncertain behavior of skin: Secondary | ICD-10-CM | POA: Diagnosis not present

## 2022-04-07 NOTE — Progress Notes (Signed)
Established Patient Office Visit  Subjective    Patient ID: Laura Wright, female    DOB: 29-Jan-1950  Age: 72 y.o. MRN: [id number]  CC:  Chief Complaint  Patient presents with   Follow-up   Hypertension   Hyperlipidemia    HPI Laura Wright presents for follow up on chronic medical conditions. Planning for shoulder replacement soon.   Hypertension: -Medications: Losartan-HCTZ 50-12.5 mg  -Patient is compliant with above medications and reports no side effects. -Checking BP at home (average): Does not check -Denies any SOB, CP, vision changes, LE edema or symptoms of hypotension  HLD: -Medications: Crestor 10 mg -Patient is compliant with above medications and reports no side effects.  -Last lipid panel: 3/23 TC 139, triglycerides 186, HDL 50, LDL 89   VMS/Hot Flashes: -Currently on Effexor 37.5 mg - controlling hot flash symptoms  -History of hysterectomy in 1986 secondary to uterine fibroids, has not had any vaginal bleeding since that time  Hx of recurrent oral HSV: -Currently Valtrex 500 mg daily, has not had a flare in several years -Does occasionally get cold sores around her mouth, needs refills  Arthritis: -Has OA in multiple sites, had both knees replaced as well as hip and is planning on shoulder soon -Does have pain in hands and fingers -Currently taking Aleve every night, also has a prescription for voltaren  Health Maintenance: -Blood work up due -Mammogram 6/23 -Colonoscopy - 6/22 - scheduled in June of this year for repeat after rectal prolapse surgery -Prevnar 20 due  Outpatient Encounter Medications as of 04/08/2022  Medication Sig   Calcium-Magnesium-Vitamin D (CALCIUM 1200+D3 PO) Take 1 tablet by mouth daily.   diclofenac (VOLTAREN) 50 MG EC tablet Take 50 mg by mouth daily as needed for moderate pain.   glucosamine-chondroitin 500-400 MG tablet Take 1 tablet by mouth in the morning and at bedtime.   LORazepam (ATIVAN) 0.5 MG tablet Take 0.5  mg by mouth daily as needed for anxiety.   losartan-hydrochlorothiazide (HYZAAR) 50-12.5 MG tablet Take 1 tablet by mouth daily.   meclizine (ANTIVERT) 25 MG tablet Take 1 tablet (25 mg total) by mouth 3 (three) times daily as needed for dizziness.   Multiple Vitamin (MULTIVITAMIN) tablet Take 1 tablet by mouth daily.   Omega-3 1000 MG CAPS Take 1,000 mg by mouth in the morning and at bedtime.   rosuvastatin (CRESTOR) 10 MG tablet Take 1 tablet (10 mg total) by mouth daily.   valACYclovir (VALTREX) 1000 MG tablet Take 0.5 tablets (500 mg total) by mouth daily.   venlafaxine XR (EFFEXOR-XR) 37.5 MG 24 hr capsule Take 1 capsule (37.5 mg total) by mouth daily.   No facility-administered encounter medications on file as of 04/08/2022.    Past Medical History:  Diagnosis Date   Anxiety    Cataract    Complex tear of medial meniscus of right knee as current injury 12/30/2011   COVID    had in Oct 2020, had h/a and runny nose for 1 week, lost taste and smell for several weeks. all s/s resolved   Dental crowns present    x 2   Family history of adverse reaction to anesthesia    daughter has PONV   Headache(784.0)    due to trigeminal neuralgia and trauma of surgery   Herpes simplex type 1 infection    Hyperlipidemia    Hypertension    dx'ed in last six months   Knee dislocation 12/2011   right   Localized osteoarthritis  of right knee 12/30/2011   Osteoporosis    Primary localized osteoarthritis of right hip 02/13/2018   Trigeminal neuralgia    Wears glasses    as stated on July 20, 2020    Past Surgical History:  Procedure Laterality Date   ABDOMINAL HYSTERECTOMY  1986   partial   COLONOSCOPY     cyst removed right hand     ganglion x 2   EVALUATION UNDER ANESTHESIA WITH HEMORRHOIDECTOMY N/A 07/21/2020   Procedure: EXAM UNDER ANESTHESIA WITH HEMORRHOIDECTOMY WITH LIGATION AND HEMORRHOIDOPEXY; REMOVAL OF PROLAPSING RECTAL TISSUE;  Surgeon: Karie Soda, MD;  Location: [facility];  Service: General;  Laterality: N/A;   HEMORRHOID SURGERY  01/20/2021   Procedure: HEMORRHOIDECTOMY;  Surgeon: Karie Soda, MD;  Location: [facility];  Service: General;;   JOINT REPLACEMENT     KNEE ARTHROSCOPY WITH MEDIAL MENISECTOMY  12/30/2011   Procedure: KNEE ARTHROSCOPY WITH MEDIAL MENISECTOMY;  Surgeon: Eulas Post, MD;  Location: [facility];  Service: Orthopedics;  Laterality: Right;  RIGHT KNEE SCOPE Partial MEDIAL MENISCECTOMY   PARTIAL KNEE ARTHROPLASTY Right 01/01/2013   Procedure: UNICOMPARTMENTAL KNEE;  Surgeon: Eulas Post, MD;  Location: [facility];  Service: Orthopedics;  Laterality: Right;   PARTIAL PROCTECTOMY BY TEM N/A 01/20/2021   Procedure: PARTIAL PROCTECTOMY BY TEM;  Surgeon: Karie Soda, MD;  Location: [facility];  Service: General;  Laterality: N/A;   TOTAL HIP ARTHROPLASTY Right 02/13/2018   Procedure: TOTAL HIP ARTHROPLASTY;  Surgeon: Teryl Lucy, MD;  Location: [facility];  Service: Orthopedics;  Laterality: Right;   TRIGEMINAL NERVE DECOMPRESSION  04/2004   TUBAL LIGATION      Family History  Problem Relation Age of Onset   Anesthesia problems Daughter        post-op N/V   Arthritis Mother    Asthma Mother    Heart disease Mother    Diabetes Father    Stroke Father     Social History   Socioeconomic History   Marital status: Married    Spouse name: Not on file   Number of children: Not on file   Years of education: Not on file   Highest education level: 12th grade  Occupational History   Not on file  Tobacco Use   Smoking status: Never   Smokeless tobacco: Never  Vaping Use   Vaping Use: Never used  Substance and Sexual Activity   Alcohol use: No   Drug use: Never   Sexual activity: Yes    Birth control/protection: Surgical  Other Topics Concern   Not on file  Social History Narrative   Not on file   Social Determinants of Health   Financial Resource Strain: Low Risk  (04/05/2022)   Overall Financial  Resource Strain (CARDIA)    Difficulty of Paying Living Expenses: Not hard at all  Food Insecurity: No Food Insecurity (04/05/2022)   Hunger Vital Sign    Worried About Running Out of Food in the Last Year: Never true    Ran Out of Food in the Last Year: Never true  Transportation Needs: No Transportation Needs (04/05/2022)   PRAPARE - Administrator, Civil Service (Medical): No    Lack of Transportation (Non-Medical): No  Physical Activity: Insufficiently Active (04/05/2022)   Exercise Vital Sign    Days of Exercise per Week: 3 days    Minutes of Exercise per Session: 20 min  Stress: No Stress Concern Present (04/05/2022)   [employer]  of Occupational Health - Occupational Stress Questionnaire    Feeling of Stress : Only a little  Social Connections: Socially Integrated (04/05/2022)   Social Connection and Isolation Panel [NHANES]    Frequency of Communication with Friends and Family: More than three times a week    Frequency of Social Gatherings with Friends and Family: Once a week    Attends Religious Services: More than 4 times per year    Active Member of [employer] or Organizations: Yes    Attends Engineer, structural: More than 4 times per year    Marital Status: Married  Catering manager Violence: Not on file    Review of Systems  Constitutional:  Negative for chills and fever.  Respiratory:  Negative for shortness of breath.   Cardiovascular:  Negative for chest pain.  Musculoskeletal:  Positive for joint pain.  Neurological:  Negative for headaches.        Objective    BP 136/84   Pulse 79   Temp 97.9 F (36.6 C) (Oral)   Resp 16   Ht 5' 4.5" (1.638 m)   Wt 166 lb 8 oz (75.5 kg)   SpO2 97%   BMI 28.14 kg/m   Physical Exam Constitutional:      Appearance: Normal appearance.  HENT:     Head: Normocephalic and atraumatic.     Mouth/Throat:     Mouth: Mucous membranes are moist.     Pharynx: Oropharynx is clear.  Eyes:      Conjunctiva/sclera: Conjunctivae normal.  Cardiovascular:     Rate and Rhythm: Normal rate and regular rhythm.  Pulmonary:     Effort: Pulmonary effort is normal.     Breath sounds: Normal breath sounds.  Musculoskeletal:     Right lower leg: No edema.     Left lower leg: No edema.  Skin:    General: Skin is warm and dry.     Findings: Bruising present.  Neurological:     General: No focal deficit present.     Mental Status: She is alert. Mental status is at baseline.  Psychiatric:        Mood and Affect: Mood normal.        Behavior: Behavior normal.         Assessment & Plan:   1. Essential (primary) hypertension: Chronic and stable.  Blood work due today.  Continue losartan-HCTZ 50-12.5 mg daily, refilled today.  Follow-up in 6 months.  - CBC w/Diff/Platelet - COMPLETE METABOLIC PANEL WITH GFR - losartan-hydrochlorothiazide (HYZAAR) 50-12.5 MG tablet; Take 1 tablet by mouth daily.  Dispense: 90 tablet; Refill: 1  2. Mixed hyperlipidemia: Due for fasting labs today.  Continue Crestor 10 mg, refilled.  - Lipid Profile - rosuvastatin (CRESTOR) 10 MG tablet; Take 1 tablet (10 mg total) by mouth daily.  Dispense: 90 tablet; Refill: 1  3. HSV-1 infection: Stable, currently taking Valtrex 500 mg daily for HSV prophylaxis, refilled.  - valACYclovir (VALTREX) 1000 MG tablet; Take 0.5 tablets (500 mg total) by mouth daily.  Dispense: 90 tablet; Refill: 1  4. Vasomotor symptoms due to menopause: Well-controlled, continue Effexor 37.5 mg daily, refilled.  - venlafaxine XR (EFFEXOR-XR) 37.5 MG 24 hr capsule; Take 1 capsule (37.5 mg total) by mouth daily.  Dispense: 90 capsule; Refill: 1  5. Arthritis: Discontinue Voltaren and Aleve and try Celebrex 50 mg as needed for pain instead.  Check kidney function today.  Patient counseled to take medication with food and avoid all other anti-inflammatories.  -  celecoxib (CELEBREX) 50 MG capsule; Take 1 capsule (50 mg total) by mouth daily  as needed for pain.  Dispense: 30 capsule; Refill: 0  6. Need for hepatitis C screening test: Screening due today.  - Hepatitis C Antibody  7. Vaccine for streptococcus pneumoniae and influenza: Prevnar 20 vaccine administered.  - Pneumococcal conjugate vaccine 20-valent (Prevnar 20)   Return in about 6 months (around 10/08/2022).   Margarita Mail, DO

## 2022-04-08 ENCOUNTER — Encounter: Payer: Self-pay | Admitting: Internal Medicine

## 2022-04-08 ENCOUNTER — Ambulatory Visit (INDEPENDENT_AMBULATORY_CARE_PROVIDER_SITE_OTHER): Payer: Medicare Other | Admitting: Internal Medicine

## 2022-04-08 VITALS — BP 136/84 | HR 79 | Temp 97.9°F | Resp 16 | Ht 64.5 in | Wt 166.5 lb

## 2022-04-08 DIAGNOSIS — I10 Essential (primary) hypertension: Secondary | ICD-10-CM

## 2022-04-08 DIAGNOSIS — E782 Mixed hyperlipidemia: Secondary | ICD-10-CM

## 2022-04-08 DIAGNOSIS — Z23 Encounter for immunization: Secondary | ICD-10-CM

## 2022-04-08 DIAGNOSIS — Z1159 Encounter for screening for other viral diseases: Secondary | ICD-10-CM

## 2022-04-08 DIAGNOSIS — B009 Herpesviral infection, unspecified: Secondary | ICD-10-CM

## 2022-04-08 DIAGNOSIS — M1990 Unspecified osteoarthritis, unspecified site: Secondary | ICD-10-CM

## 2022-04-08 DIAGNOSIS — N951 Menopausal and female climacteric states: Secondary | ICD-10-CM

## 2022-04-08 MED ORDER — CELECOXIB 50 MG PO CAPS
50.0000 mg | ORAL_CAPSULE | Freq: Every day | ORAL | 0 refills | Status: DC | PRN
Start: 2022-04-08 — End: 2022-10-13

## 2022-04-08 MED ORDER — LOSARTAN POTASSIUM-HCTZ 50-12.5 MG PO TABS
1.0000 | ORAL_TABLET | Freq: Every day | ORAL | 1 refills | Status: DC
Start: 2022-04-08 — End: 2022-10-13

## 2022-04-08 MED ORDER — ROSUVASTATIN CALCIUM 10 MG PO TABS: 10.0000 mg | ORAL_TABLET | Freq: Every day | ORAL | 1 refills | Status: DC

## 2022-04-08 MED ORDER — VENLAFAXINE HCL ER 37.5 MG PO CP24: 37.5000 mg | ORAL_CAPSULE | Freq: Every day | ORAL | 1 refills | Status: DC

## 2022-04-08 MED ORDER — VALACYCLOVIR HCL 1 G PO TABS: 500.0000 mg | ORAL_TABLET | Freq: Every day | ORAL | 1 refills | Status: DC

## 2022-04-08 NOTE — Patient Instructions (Addendum)
It was great seeing you today!  Plan discussed at today's visit: -Blood work ordered today, results will be uploaded to MyChart.  -Prevnar 20 vaccine today -Medications refilled -Use Celebrex as needed for pain, take with food and avoid all other anti-inflammatories - can alternate with Tylenol. Let me know if it is working for you and I will refill.  Follow up in: 6 months or sooner as needed  Take care and let us know if you have any questions or concerns prior to your next visit.  Dr. Caralee Ates

## 2022-04-11 LAB — LIPID PANEL
Cholesterol: 142 mg/dL (ref <200)
HDL: 66 mg/dL (ref >=50)
LDL Cholesterol (Calc): 58 mg/dL (calc)
Non-HDL Cholesterol (Calc): 76 mg/dL (calc) (ref <130)
Total CHOL/HDL Ratio: 2.2 (calc) (ref <5.0)
Triglycerides: 94 mg/dL (ref <150)

## 2022-04-11 LAB — CBC WITH DIFFERENTIAL/PLATELET
Absolute Monocytes: 663 cells/uL (ref 200–950)
Basophils Absolute: 50 cells/uL (ref 0–200)
Basophils Relative: 0.8 %
Eosinophils Absolute: 174 cells/uL (ref 15–500)
Eosinophils Relative: 2.8 %
HCT: 41.8 % (ref 35.0–45.0)
Hemoglobin: 13.7 g/dL (ref 11.7–15.5)
Lymphs Abs: 2003 cells/uL (ref 850–3900)
MCH: 28.2 pg (ref 27.0–33.0)
MCHC: 32.8 g/dL (ref 32.0–36.0)
MCV: 86.2 fL (ref 80.0–100.0)
MPV: 9.7 fL (ref 7.5–12.5)
Monocytes Relative: 10.7 %
Neutro Abs: 3311 cells/uL (ref 1500–7800)
Neutrophils Relative %: 53.4 %
Platelets: 329 10*3/uL (ref 140–400)
RBC: 4.85 10*6/uL (ref 3.80–5.10)
RDW: 13.7 % (ref 11.0–15.0)
Total Lymphocyte: 32.3 %
WBC: 6.2 10*3/uL (ref 3.8–10.8)

## 2022-04-11 LAB — COMPLETE METABOLIC PANEL WITH GFR
AG Ratio: 1.8 (calc) (ref 1.0–2.5)
ALT: 17 U/L (ref 6–29)
AST: 18 U/L (ref 10–35)
Albumin: 4.3 g/dL (ref 3.6–5.1)
Alkaline phosphatase (APISO): 82 U/L (ref 37–153)
BUN: 18 mg/dL (ref 7–25)
CO2: 31 mmol/L (ref 20–32)
Calcium: 9.3 mg/dL (ref 8.6–10.4)
Chloride: 99 mmol/L (ref 98–110)
Creat: 0.9 mg/dL (ref 0.60–1.00)
Globulin: 2.4 g/dL (calc) (ref 1.9–3.7)
Glucose, Bld: 83 mg/dL (ref 65–99)
Potassium: 4.3 mmol/L (ref 3.5–5.3)
Sodium: 138 mmol/L (ref 135–146)
Total Bilirubin: 0.6 mg/dL (ref 0.2–1.2)
Total Protein: 6.7 g/dL (ref 6.1–8.1)
eGFR: 69 mL/min/{1.73_m2} (ref >=60)

## 2022-04-11 LAB — HEPATITIS C ANTIBODY: Hepatitis C Ab: NONREACTIVE

## 2022-04-26 DIAGNOSIS — C44319 Basal cell carcinoma of skin of other parts of face: Secondary | ICD-10-CM | POA: Diagnosis not present

## 2022-05-03 DIAGNOSIS — L905 Scar conditions and fibrosis of skin: Secondary | ICD-10-CM | POA: Diagnosis not present

## 2022-05-09 ENCOUNTER — Encounter: Payer: Self-pay | Admitting: Internal Medicine

## 2022-06-08 DIAGNOSIS — M67472 Ganglion, left ankle and foot: Secondary | ICD-10-CM | POA: Diagnosis not present

## 2022-06-08 DIAGNOSIS — M19012 Primary osteoarthritis, left shoulder: Secondary | ICD-10-CM | POA: Diagnosis not present

## 2022-06-16 ENCOUNTER — Encounter: Payer: Self-pay | Admitting: Internal Medicine

## 2022-07-21 DIAGNOSIS — K649 Unspecified hemorrhoids: Secondary | ICD-10-CM | POA: Diagnosis not present

## 2022-07-21 DIAGNOSIS — D120 Benign neoplasm of cecum: Secondary | ICD-10-CM | POA: Diagnosis not present

## 2022-07-21 DIAGNOSIS — D122 Benign neoplasm of ascending colon: Secondary | ICD-10-CM | POA: Diagnosis not present

## 2022-07-21 DIAGNOSIS — Z09 Encounter for follow-up examination after completed treatment for conditions other than malignant neoplasm: Secondary | ICD-10-CM | POA: Diagnosis not present

## 2022-07-21 DIAGNOSIS — D128 Benign neoplasm of rectum: Secondary | ICD-10-CM | POA: Diagnosis not present

## 2022-07-21 DIAGNOSIS — D125 Benign neoplasm of sigmoid colon: Secondary | ICD-10-CM | POA: Diagnosis not present

## 2022-07-21 DIAGNOSIS — Z86010 Personal history of colonic polyps: Secondary | ICD-10-CM | POA: Diagnosis not present

## 2022-07-25 DIAGNOSIS — D125 Benign neoplasm of sigmoid colon: Secondary | ICD-10-CM | POA: Diagnosis not present

## 2022-07-25 DIAGNOSIS — D120 Benign neoplasm of cecum: Secondary | ICD-10-CM | POA: Diagnosis not present

## 2022-07-25 DIAGNOSIS — D122 Benign neoplasm of ascending colon: Secondary | ICD-10-CM | POA: Diagnosis not present

## 2022-07-25 DIAGNOSIS — D128 Benign neoplasm of rectum: Secondary | ICD-10-CM | POA: Diagnosis not present

## 2022-08-11 ENCOUNTER — Ambulatory Visit (INDEPENDENT_AMBULATORY_CARE_PROVIDER_SITE_OTHER): Payer: Medicare Other

## 2022-08-11 VITALS — Ht 64.5 in | Wt 166.0 lb

## 2022-08-11 DIAGNOSIS — Z0000 Encounter for general adult medical examination without abnormal findings: Secondary | ICD-10-CM | POA: Diagnosis not present

## 2022-08-11 DIAGNOSIS — Z1231 Encounter for screening mammogram for malignant neoplasm of breast: Secondary | ICD-10-CM

## 2022-08-11 NOTE — Progress Notes (Signed)
Subjective:   Laura Wright is a 72 y.o. female who presents for an Initial Medicare Annual Wellness Visit.  Visit Complete: Virtual  I connected with  Laura Wright on 08/11/22 by a audio enabled telemedicine application and verified that I am speaking with the correct person using two identifiers.  Patient Location: Home  Provider Location: Home Office  I discussed the limitations of evaluation and management by telemedicine. The patient expressed understanding and agreed to proceed.  Vital Signs: Unable to obtain new vitals due to this being a telehealth visit.  Patient Medicare AWV questionnaire was completed by the patient on (not done); I have confirmed that all information answered by patient is correct and no changes since this date.  Review of Systems    Cardiac Risk Factors include: advanced age (>35men, >42 women);hypertension    Objective:    Today's Vitals   08/11/22 1047  Weight: 166 lb (75.3 kg)  Height: 5' 4.5" (1.638 m)  PainSc: 5    Body mass index is 28.05 kg/m.     08/11/2022   11:00 AM 01/20/2021   10:41 AM 01/07/2021   10:28 AM 07/21/2020   12:17 PM 02/13/2018    6:21 AM 02/06/2018    8:23 AM 11/09/2015    1:35 PM  Advanced Directives  Does Patient Have a Medical Advance Directive? No No No No No No No  Would patient like information on creating a medical advance directive?  No - Patient declined No - Patient declined No - Patient declined No - Patient declined No - Patient declined No - patient declined information    Current Medications (verified) Outpatient Encounter Medications as of 08/11/2022  Medication Sig   Calcium-Magnesium-Vitamin D (CALCIUM 1200+D3 PO) Take 1 tablet by mouth daily.   celecoxib (CELEBREX) 50 MG capsule Take 1 capsule (50 mg total) by mouth daily as needed for pain.   glucosamine-chondroitin 500-400 MG tablet Take 1 tablet by mouth in the morning and at bedtime.   LORazepam (ATIVAN) 0.5 MG tablet Take 0.5 mg by mouth daily  as needed for anxiety.   losartan-hydrochlorothiazide (HYZAAR) 50-12.5 MG tablet Take 1 tablet by mouth daily.   meclizine (ANTIVERT) 25 MG tablet Take 1 tablet (25 mg total) by mouth 3 (three) times daily as needed for dizziness.   Multiple Vitamin (MULTIVITAMIN) tablet Take 1 tablet by mouth daily.   Omega-3 1000 MG CAPS Take 1,000 mg by mouth in the morning and at bedtime.   rosuvastatin (CRESTOR) 10 MG tablet Take 1 tablet (10 mg total) by mouth daily.   valACYclovir (VALTREX) 1000 MG tablet Take 0.5 tablets (500 mg total) by mouth daily.   venlafaxine XR (EFFEXOR-XR) 37.5 MG 24 hr capsule Take 1 capsule (37.5 mg total) by mouth daily.   No facility-administered encounter medications on file as of 08/11/2022.    Allergies (verified) Macrodantin [nitrofurantoin macrocrystal]   History: Past Medical History:  Diagnosis Date   Anxiety    Cataract    Complex tear of medial meniscus of right knee as current injury 12/30/2011   COVID    had in Oct 2020, had h/a and runny nose for 1 week, lost taste and smell for several weeks. all s/s resolved   Dental crowns present    x 2   Family history of adverse reaction to anesthesia    daughter has PONV   Headache(784.0)    due to trigeminal neuralgia and trauma of surgery   Herpes simplex type 1 infection  Hyperlipidemia    Hypertension    dx'ed in last six months   Knee dislocation 12/2011   right   Localized osteoarthritis of right knee 12/30/2011   Osteoporosis    Primary localized osteoarthritis of right hip 02/13/2018   Trigeminal neuralgia    Wears glasses    as stated on July 20, 2020   Past Surgical History:  Procedure Laterality Date   ABDOMINAL HYSTERECTOMY  1986   partial   COLONOSCOPY     cyst removed right hand     ganglion x 2   EVALUATION UNDER ANESTHESIA WITH HEMORRHOIDECTOMY N/A 07/21/2020   Procedure: EXAM UNDER ANESTHESIA WITH HEMORRHOIDECTOMY WITH LIGATION AND HEMORRHOIDOPEXY; REMOVAL OF PROLAPSING RECTAL  TISSUE;  Surgeon: Karie Soda, MD;  Location: [facility];  Service: General;  Laterality: N/A;   HEMORRHOID SURGERY  01/20/2021   Procedure: HEMORRHOIDECTOMY;  Surgeon: Karie Soda, MD;  Location: [facility];  Service: General;;   JOINT REPLACEMENT     KNEE ARTHROSCOPY WITH MEDIAL MENISECTOMY  12/30/2011   Procedure: KNEE ARTHROSCOPY WITH MEDIAL MENISECTOMY;  Surgeon: Eulas Post, MD;  Location: [facility];  Service: Orthopedics;  Laterality: Right;  RIGHT KNEE SCOPE Partial MEDIAL MENISCECTOMY   PARTIAL KNEE ARTHROPLASTY Right 01/01/2013   Procedure: UNICOMPARTMENTAL KNEE;  Surgeon: Eulas Post, MD;  Location: [facility];  Service: Orthopedics;  Laterality: Right;   PARTIAL PROCTECTOMY BY TEM N/A 01/20/2021   Procedure: PARTIAL PROCTECTOMY BY TEM;  Surgeon: Karie Soda, MD;  Location: [facility];  Service: General;  Laterality: N/A;   TOTAL HIP ARTHROPLASTY Right 02/13/2018   Procedure: TOTAL HIP ARTHROPLASTY;  Surgeon: Teryl Lucy, MD;  Location: [facility];  Service: Orthopedics;  Laterality: Right;   TRIGEMINAL NERVE DECOMPRESSION  04/2004   TUBAL LIGATION     Family History  Problem Relation Age of Onset   Anesthesia problems Daughter        post-op N/V   Arthritis Mother    Asthma Mother    Heart disease Mother    Diabetes Father    Stroke Father    Social History   Socioeconomic History   Marital status: Married    Spouse name: Not on file   Number of children: Not on file   Years of education: Not on file   Highest education level: 12th grade  Occupational History   Not on file  Tobacco Use   Smoking status: Never   Smokeless tobacco: Never  Vaping Use   Vaping status: Never Used  Substance and Sexual Activity   Alcohol use: No   Drug use: Never   Sexual activity: Yes    Birth control/protection: Surgical  Other Topics Concern   Not on file  Social History Narrative   Not on file   Social Determinants of Health   Financial  Resource Strain: Low Risk  (08/11/2022)   Overall Financial Resource Strain (CARDIA)    Difficulty of Paying Living Expenses: Not hard at all  Food Insecurity: No Food Insecurity (08/11/2022)   Hunger Vital Sign    Worried About Running Out of Food in the Last Year: Never true    Ran Out of Food in the Last Year: Never true  Transportation Needs: No Transportation Needs (08/11/2022)   PRAPARE - Administrator, Civil Service (Medical): No    Lack of Transportation (Non-Medical): No  Physical Activity: Sufficiently Active (08/11/2022)   Exercise Vital Sign    Days of Exercise per Week: 7  days    Minutes of Exercise per Session: 30 min  Recent Concern: Physical Activity - Insufficiently Active (08/11/2022)   Exercise Vital Sign    Days of Exercise per Week: 3 days    Minutes of Exercise per Session: 20 min  Stress: No Stress Concern Present (08/11/2022)   [employer] of Occupational Health - Occupational Stress Questionnaire    Feeling of Stress : Not at all  Social Connections: Socially Integrated (08/11/2022)   Social Connection and Isolation Panel [NHANES]    Frequency of Communication with Friends and Family: More than three times a week    Frequency of Social Gatherings with Friends and Family: Once a week    Attends Religious Services: More than 4 times per year    Active Member of [employer] or Organizations: Yes    Attends Engineer, structural: More than 4 times per year    Marital Status: Married    Tobacco Counseling Counseling given: Not Answered   Clinical Intake:  Pre-visit preparation completed: Yes  Pain : 0-10 Pain Score: 5  Pain Type: Chronic pain Pain Location: Shoulder (left) Pain Orientation: Left Pain Descriptors / Indicators: Aching, Throbbing Pain Onset: More than a month ago Pain Frequency: Constant Pain Relieving Factors: ice, tylenol  Pain Relieving Factors: ice, tylenol  BMI - recorded: 28.05 Nutritional Status: BMI 25 -29  Overweight Nutritional Risks: None Diabetes: No  How often do you need to have someone help you when you read instructions, pamphlets, or other written materials from your doctor or pharmacy?: 1 - Never     Comments: lives with husband Information entered by :: B.,LPN   Activities of Daily Living    08/11/2022   11:00 AM 04/08/2022    8:11 AM  In your present state of health, do you have any difficulty performing the following activities:  Hearing? 0 0  Vision? 0 0  Difficulty concentrating or making decisions? 0 0  Walking or climbing stairs? 0 0  Dressing or bathing? 0 0  Doing errands, shopping? 0 0  Preparing Food and eating ? N   Using the Toilet? N   In the past six months, have you accidently leaked urine? N   Do you have problems with loss of bowel control? N   Managing your Medications? N   Managing your Finances? N   Housekeeping or managing your Housekeeping? N     Patient Care Team: Margarita Mail, DO as PCP - General (Internal Medicine) Karie Soda, MD as Consulting Physician (General Surgery) Teryl Lucy, MD as Consulting Physician (Orthopedic Surgery)  Indicate any recent Medical Services you may have received from other than [facility] providers in the past year (date may be approximate).     Assessment:   This is a routine wellness examination for Laura Wright.  Hearing/Vision screen Hearing Screening - Comments:: Adequate hearing Vision Screening - Comments:: Adequate vision [facility]  Dietary issues and exercise activities discussed:     Goals Addressed   None    Depression Screen    08/11/2022   10:58 AM 04/08/2022    8:11 AM 10/07/2021    1:07 PM  PHQ 2/9 Scores  PHQ - 2 Score 0 0 0  PHQ- 9 Score  0 0    Fall Risk    08/11/2022   10:51 AM 04/08/2022    8:11 AM 10/07/2021    1:07 PM  Fall Risk   Falls in the past year? 0 0 0  Number falls in past  yr: 0 0 0  Injury with Fall? 0 0 0  Risk for fall due to : No Fall Risks No Fall  Risks   Follow up Education provided;Falls prevention discussed Falls prevention discussed;Education provided;Falls evaluation completed     MEDICARE RISK AT HOME:  Medicare Risk at Home - 08/11/22 1051     Any stairs in or around the home? Yes   outside   If so, are there any without handrails? Yes    Home free of loose throw rugs in walkways, pet beds, electrical cords, etc? Yes    Adequate lighting in your home to reduce risk of falls? Yes    Life alert? No    Use of a cane, walker or w/c? No    Grab bars in the bathroom? No    Shower chair or bench in shower? Yes    Elevated toilet seat or a handicapped toilet? Yes             TIMED UP AND GO:  Was the test performed? No    Cognitive Function:        08/11/2022   11:01 AM  6CIT Screen  What Year? 0 points  What month? 0 points  What time? 0 points  Count back from 20 0 points  Months in reverse 0 points  Repeat phrase 0 points  Total Score 0 points    Immunizations Immunization History  Administered Date(s) Administered   Fluad Quad(high Dose 65+) 10/07/2021   Influenza, High Dose Seasonal PF 10/05/2017, 12/03/2018, 10/16/2020   Influenza-Unspecified 10/20/2015, 10/25/2016, 10/16/2020   PNEUMOCOCCAL CONJUGATE-20 04/08/2022   Pneumococcal Conjugate-13 11/16/2017   Pneumococcal Polysaccharide-23 02/01/2019   Tdap 04/28/2014    TDAP status: Up to date  Flu Vaccine status: Up to date  Pneumococcal vaccine status: Up to date  Covid-19 vaccine status: Completed vaccines  Qualifies for Shingles Vaccine? Yes   Zostavax completed No   Shingrix Completed?: No.    Education has been provided regarding the importance of this vaccine. Patient has been advised to call insurance company to determine out of pocket expense if they have not yet received this vaccine. Advised may also receive vaccine at local pharmacy or Health Dept. Verbalized acceptance and understanding.  Screening Tests Health Maintenance   Topic Date Due   COVID-19 Vaccine (1) Never done   Zoster Vaccines- Shingrix (1 of 2) Never done   INFLUENZA VACCINE  08/04/2022   MAMMOGRAM  06/08/2023   Medicare Annual Wellness (AWV)  08/11/2023   DTaP/Tdap/Td (2 - Td or Tdap) 04/27/2024   Colonoscopy  10/09/2031   Pneumonia Vaccine 22+ Years old  Completed   DEXA SCAN  Completed   Hepatitis C Screening  Completed   HPV VACCINES  Aged Out    Health Maintenance  Health Maintenance Due  Topic Date Due   COVID-19 Vaccine (1) Never done   Zoster Vaccines- Shingrix (1 of 2) Never done   INFLUENZA VACCINE  08/04/2022    Colorectal cancer screening: Type of screening: Colonoscopy. Completed yes. Repeat every 3 years  Mammogram status: Ordered yes. Pt provided with contact info and advised to call to schedule appt.   Bone Density status: Completed yes. Results reflect: Bone density results: NORMAL. Repeat every 5 years.  Lung Cancer Screening: (Low Dose CT Chest recommended if Age 61-80 years, 20 pack-year currently smoking OR have quit w/in 15years.) does not qualify.   Lung Cancer Screening Referral: no  Additional Screening:  Hepatitis C Screening: does not  qualify; Completed yes  Vision Screening: Recommended annual ophthalmology exams for early detection of glaucoma and other disorders of the eye. Is the patient up to date with their annual eye exam?  Yes  Who is the provider or what is the name of the office in which the patient attends annual eye exams? [facility] If pt is not established with a provider, would they like to be referred to a provider to establish care? No .   Dental Screening: Recommended annual dental exams for proper oral hygiene  Diabetic Foot Exam: n/a  Community Resource Referral / Chronic Care Management: CRR required this visit?  No   CCM required this visit?  No     Plan:     I have personally reviewed and noted the following in the patient's chart:   Medical and social  history Use of alcohol, tobacco or illicit drugs  Current medications and supplements including opioid prescriptions. Patient is not currently taking opioid prescriptions. Functional ability and status Nutritional status Physical activity Advanced directives List of other physicians Hospitalizations, surgeries, and ER visits in previous 12 months Vitals Screenings to include cognitive, depression, and falls Referrals and appointments  In addition, I have reviewed and discussed with patient certain preventive protocols, quality metrics, and best practice recommendations. A written personalized care plan for preventive services as well as general preventive health recommendations were provided to patient.     Sue Lush, LPN   6/0/4540   After Visit Summary: (MyChart) Due to this being a telephonic visit, the after visit summary with patients personalized plan was offered to patient via MyChart   Nurse Notes: The patient states she is doing well and has no concerns or questions at this time.

## 2022-08-11 NOTE — Patient Instructions (Addendum)
Laura Wright , Thank you for taking time to come for your Medicare Wellness Visit. I appreciate your ongoing commitment to your health goals. Please review the following plan we discussed and let me know if I can assist you in the future.   Referrals/Orders/Follow-Ups/Clinician Recommendations: referral for [facility]  This is a list of the screening recommended for you and due dates:  Health Maintenance  Topic Date Due   COVID-19 Vaccine (1) Never done   Zoster (Shingles) Vaccine (1 of 2) Never done   Flu Shot  08/04/2022   Mammogram  06/08/2023   Medicare Annual Wellness Visit  08/11/2023   DTaP/Tdap/Td vaccine (2 - Td or Tdap) 04/27/2024   Colon Cancer Screening  10/09/2031   Pneumonia Vaccine  Completed   DEXA scan (bone density measurement)  Completed   Hepatitis C Screening  Completed   HPV Vaccine  Aged Out    Advanced directives: (Declined) Advance directive discussed with you today. Even though you declined this today, please call our office should you change your mind, and we can give you the proper paperwork for you to fill out.  Next Medicare Annual Wellness Visit scheduled for next year: Yes 08/17/23 @11 :45am telephone  Preventive Care 65 Years and Older, Female Preventive care refers to lifestyle choices and visits with your health care provider that can promote health and wellness. What does preventive care include? A yearly physical exam. This is also called an annual well check. Dental exams once or twice a year. Routine eye exams. Ask your health care provider how often you should have your eyes checked. Personal lifestyle choices, including: Daily care of your teeth and gums. Regular physical activity. Eating a healthy diet. Avoiding tobacco and drug use. Limiting alcohol use. Practicing safe sex. Taking low-dose aspirin every day. Taking vitamin and mineral supplements as recommended by your health care provider. What happens during an annual well check? The  services and screenings done by your health care provider during your annual well check will depend on your age, overall health, lifestyle risk factors, and family history of disease. Counseling  Your health care provider may ask you questions about your: Alcohol use. Tobacco use. Drug use. Emotional well-being. Home and relationship well-being. Sexual activity. Eating habits. History of falls. Memory and ability to understand (cognition). Work and work Astronomer. Reproductive health. Screening  You may have the following tests or measurements: Height, weight, and BMI. Blood pressure. Lipid and cholesterol levels. These may be checked every 5 years, or more frequently if you are over 49 years old. Skin check. Lung cancer screening. You may have this screening every year starting at age 41 if you have a 30-pack-year history of smoking and currently smoke or have quit within the past 15 years. Fecal occult blood test (FOBT) of the stool. You may have this test every year starting at age 38. Flexible sigmoidoscopy or colonoscopy. You may have a sigmoidoscopy every 5 years or a colonoscopy every 10 years starting at age 74. Hepatitis C blood test. Hepatitis B blood test. Sexually transmitted disease (STD) testing. Diabetes screening. This is done by checking your blood sugar (glucose) after you have not eaten for a while (fasting). You may have this done every 1-3 years. Bone density scan. This is done to screen for osteoporosis. You may have this done starting at age 15. Mammogram. This may be done every 1-2 years. Talk to your health care provider about how often you should have regular mammograms. Talk with your health  care provider about your test results, treatment options, and if necessary, the need for more tests. Vaccines  Your health care provider may recommend certain vaccines, such as: Influenza vaccine. This is recommended every year. Tetanus, diphtheria, and acellular  pertussis (Tdap, Td) vaccine. You may need a Td booster every 10 years. Zoster vaccine. You may need this after age 71. Pneumococcal 13-valent conjugate (PCV13) vaccine. One dose is recommended after age 71 Pneumococcal polysaccharide (PPSV23) vaccine. One dose is recommended after age 71 Talk to your health care provider about which screenings and vaccines you need and how often you need them. This information is not intended to replace advice given to you by your health care provider. Make sure you discuss any questions you have with your health care provider. Document Released: 01/16/2015 Document Revised: 09/09/2015 Document Reviewed: 10/21/2014 Elsevier Interactive Patient Education  2017 [employer].  Fall Prevention in the Home Falls can cause injuries. They can happen to people of all ages. There are many things you can do to make your home safe and to help prevent falls. What can I do on the outside of my home? Regularly fix the edges of walkways and driveways and fix any cracks. Remove anything that might make you trip as you walk through a door, such as a raised step or threshold. Trim any bushes or trees on the path to your home. Use bright outdoor lighting. Clear any walking paths of anything that might make someone trip, such as rocks or tools. Regularly check to see if handrails are loose or broken. Make sure that both sides of any steps have handrails. Any raised decks and porches should have guardrails on the edges. Have any leaves, snow, or ice cleared regularly. Use sand or salt on walking paths during winter. Clean up any spills in your garage right away. This includes oil or grease spills. What can I do in the bathroom? Use night lights. Install grab bars by the toilet and in the tub and shower. Do not use towel bars as grab bars. Use non-skid mats or decals in the tub or shower. If you need to sit down in the shower, use a plastic, non-slip stool. Keep the floor  dry. Clean up any water that spills on the floor as soon as it happens. Remove soap buildup in the tub or shower regularly. Attach bath mats securely with double-sided non-slip rug tape. Do not have throw rugs and other things on the floor that can make you trip. What can I do in the bedroom? Use night lights. Make sure that you have a light by your bed that is easy to reach. Do not use any sheets or blankets that are too big for your bed. They should not hang down onto the floor. Have a firm chair that has side arms. You can use this for support while you get dressed. Do not have throw rugs and other things on the floor that can make you trip. What can I do in the kitchen? Clean up any spills right away. Avoid walking on wet floors. Keep items that you use a lot in easy-to-reach places. If you need to reach something above you, use a strong step stool that has a grab bar. Keep electrical cords out of the way. Do not use floor polish or wax that makes floors slippery. If you must use wax, use non-skid floor wax. Do not have throw rugs and other things on the floor that can make you trip. What can  I do with my stairs? Do not leave any items on the stairs. Make sure that there are handrails on both sides of the stairs and use them. Fix handrails that are broken or loose. Make sure that handrails are as long as the stairways. Check any carpeting to make sure that it is firmly attached to the stairs. Fix any carpet that is loose or worn. Avoid having throw rugs at the top or bottom of the stairs. If you do have throw rugs, attach them to the floor with carpet tape. Make sure that you have a light switch at the top of the stairs and the bottom of the stairs. If you do not have them, ask someone to add them for you. What else can I do to help prevent falls? Wear shoes that: Do not have high heels. Have rubber bottoms. Are comfortable and fit you well. Are closed at the toe. Do not wear  sandals. If you use a stepladder: Make sure that it is fully opened. Do not climb a closed stepladder. Make sure that both sides of the stepladder are locked into place. Ask someone to hold it for you, if possible. Clearly mark and make sure that you can see: Any grab bars or handrails. First and last steps. Where the edge of each step is. Use tools that help you move around (mobility aids) if they are needed. These include: Canes. Walkers. Scooters. Crutches. Turn on the lights when you go into a dark area. Replace any light bulbs as soon as they burn out. Set up your furniture so you have a clear path. Avoid moving your furniture around. If any of your floors are uneven, fix them. If there are any pets around you, be aware of where they are. Review your medicines with your doctor. Some medicines can make you feel dizzy. This can increase your chance of falling. Ask your doctor what other things that you can do to help prevent falls. This information is not intended to replace advice given to you by your health care provider. Make sure you discuss any questions you have with your health care provider. Document Released: 10/16/2008 Document Revised: 05/28/2015 Document Reviewed: 01/24/2014 Elsevier Interactive Patient Education  2017 [employer].

## 2022-08-20 IMAGING — XA IR PICC >5YO
1 series · 1 of 1 positions shown · IV contrast (agent unspecified)
Comparison: none

INDICATION: 69-year-old female with myofascial lower extremity infection
requiring long-term intravenous antibiotics.

EXAM:
ULTRASOUND AND FLUOROSCOPIC GUIDED PICC LINE INSERTION
MEDICATIONS:
None.
CONTRAST:  None
FLUOROSCOPY TIME:  Eighteen seconds (2 mGy)
COMPLICATIONS:
None immediate.
TECHNIQUE: The procedure, risks, benefits, and alternatives were explained to
the patient and informed written consent was obtained. A timeout was
performed prior to the initiation of the procedure.

[Series 300: ir picc placement right >5 yrs inc img g · 1 of 1 slices shown]
[im 1/1]
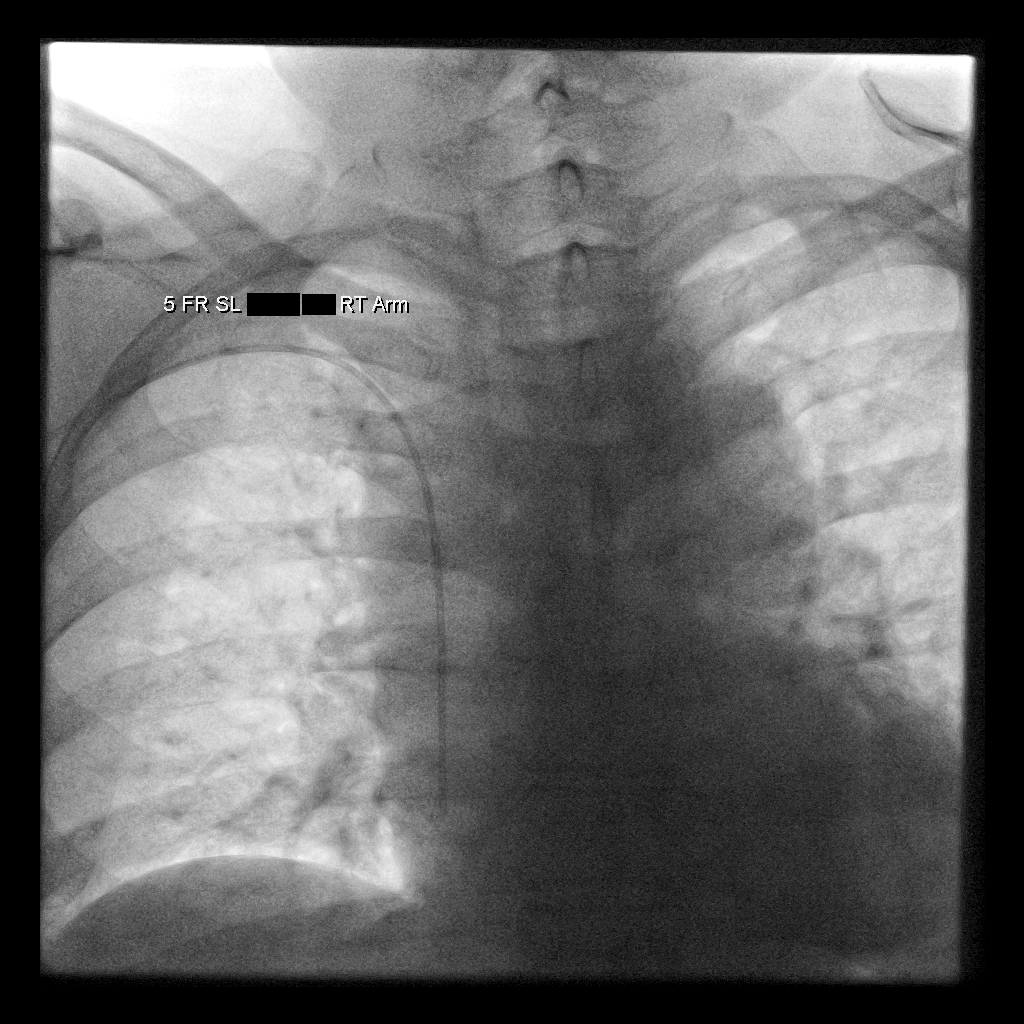

[1 of 1 positions shown; findings below may reference images not displayed]

The right upper extremity was prepped with chlorhexidine in a
sterile fashion, and a sterile drape was applied covering the
operative field. Maximum barrier sterile technique with sterile
gowns and gloves were used for the procedure. A timeout was
performed prior to the initiation of the procedure. Local anesthesia
was provided with 1% lidocaine.

Under direct ultrasound guidance, the basilic vein was accessed with
a micropuncture kit after the overlying soft tissues were
anesthetized with 1% lidocaine.

After the overlying soft tissues were anesthetized, a small venotomy
incision was created and a micropuncture kit was utilized to access
the right basilic vein. Real-time ultrasound guidance was utilized
for vascular access including the acquisition of a permanent
ultrasound image documenting patency of the accessed vessel.

A guidewire was advanced to the level of the superior caval-atrial
junction for measurement purposes and the PICC line was cut to
length. A peel-away sheath was placed and a 38 cm, 5 French, single
lumen was inserted to level of the superior caval-atrial junction. A
post procedure spot fluoroscopic was obtained. The catheter easily
aspirated and flushed and was secured in place with stat lock
device. A dressing was applied. The patient tolerated the procedure
well without immediate post procedural complication.
FINDINGS: After catheter placement, the tip lies within the superior
cavoatrial junction. The catheter aspirates and flushes normally and
is ready for immediate use.
IMPRESSION: Successful ultrasound and fluoroscopic guided placement of a right
basilic vein approach, 38 cm, 5 French, single lumen PICC with tip
at the superior caval-atrial junction. The PICC line is ready for
immediate use.

## 2022-09-07 DIAGNOSIS — M7061 Trochanteric bursitis, right hip: Secondary | ICD-10-CM | POA: Diagnosis not present

## 2022-09-07 DIAGNOSIS — M67472 Ganglion, left ankle and foot: Secondary | ICD-10-CM | POA: Diagnosis not present

## 2022-10-05 DIAGNOSIS — M19012 Primary osteoarthritis, left shoulder: Secondary | ICD-10-CM | POA: Diagnosis not present

## 2022-10-05 DIAGNOSIS — M7061 Trochanteric bursitis, right hip: Secondary | ICD-10-CM | POA: Diagnosis not present

## 2022-10-05 DIAGNOSIS — M25512 Pain in left shoulder: Secondary | ICD-10-CM | POA: Diagnosis not present

## 2022-10-05 LAB — LAB REPORT - SCANNED: EGFR: 52

## 2022-10-11 ENCOUNTER — Ambulatory Visit: Payer: Medicare Other | Admitting: Internal Medicine

## 2022-10-12 NOTE — Progress Notes (Unsigned)
Established Patient Office Visit  Subjective    Patient ID: Laura Wright, female    DOB: 10-05-1950  Age: 72 y.o. MRN: [id number]  CC:  No chief complaint on file.   HPI Laura Wright presents for follow up on chronic medical conditions. Planning for shoulder replacement soon.   Hypertension: -Medications: Losartan-HCTZ 50-12.5 mg  -Patient is compliant with above medications and reports no side effects. -Checking BP at home (average): Does not check -Denies any SOB, CP, vision changes, LE edema or symptoms of hypotension  HLD: -Medications: Crestor 10 mg -Patient is compliant with above medications and reports no side effects.  -Last lipid panel: 3/23 TC 139, triglycerides 186, HDL 50, LDL 89   VMS/Hot Flashes: -Currently on Effexor 37.5 mg - controlling hot flash symptoms  -History of hysterectomy in 1986 secondary to uterine fibroids, has not had any vaginal bleeding since that time  Hx of recurrent oral HSV: -Currently Valtrex 500 mg daily, has not had a flare in several years -Does occasionally get cold sores around her mouth, needs refills  Arthritis: -Has OA in multiple sites, had both knees replaced as well as hip and is planning on shoulder soon -Does have pain in hands and fingers -Currently taking Aleve every night, also has a prescription for voltaren  Health Maintenance: -Blood work UTD -Mammogram 6/23 -Colonoscopy - 6/22 - scheduled in June of this year for repeat after rectal prolapse surgery -Prevnar 20 due  Outpatient Encounter Medications as of 10/13/2022  Medication Sig   Calcium-Magnesium-Vitamin D (CALCIUM 1200+D3 PO) Take 1 tablet by mouth daily.   celecoxib (CELEBREX) 50 MG capsule Take 1 capsule (50 mg total) by mouth daily as needed for pain.   glucosamine-chondroitin 500-400 MG tablet Take 1 tablet by mouth in the morning and at bedtime.   LORazepam (ATIVAN) 0.5 MG tablet Take 0.5 mg by mouth daily as needed for anxiety.    losartan-hydrochlorothiazide (HYZAAR) 50-12.5 MG tablet Take 1 tablet by mouth daily.   meclizine (ANTIVERT) 25 MG tablet Take 1 tablet (25 mg total) by mouth 3 (three) times daily as needed for dizziness.   Multiple Vitamin (MULTIVITAMIN) tablet Take 1 tablet by mouth daily.   Omega-3 1000 MG CAPS Take 1,000 mg by mouth in the morning and at bedtime.   rosuvastatin (CRESTOR) 10 MG tablet Take 1 tablet (10 mg total) by mouth daily.   valACYclovir (VALTREX) 1000 MG tablet Take 0.5 tablets (500 mg total) by mouth daily.   venlafaxine XR (EFFEXOR-XR) 37.5 MG 24 hr capsule Take 1 capsule (37.5 mg total) by mouth daily.   No facility-administered encounter medications on file as of 10/13/2022.    Past Medical History:  Diagnosis Date   Anxiety    Cataract    Complex tear of medial meniscus of right knee as current injury 12/30/2011   COVID    had in Oct 2020, had h/a and runny nose for 1 week, lost taste and smell for several weeks. all s/s resolved   Dental crowns present    x 2   Family history of adverse reaction to anesthesia    daughter has PONV   Headache(784.0)    due to trigeminal neuralgia and trauma of surgery   Herpes simplex type 1 infection    Hyperlipidemia    Hypertension    dx'ed in last six months   Knee dislocation 12/2011   right   Localized osteoarthritis of right knee 12/30/2011   Osteoporosis    Primary  localized osteoarthritis of right hip 02/13/2018   Trigeminal neuralgia    Wears glasses    as stated on July 20, 2020    Past Surgical History:  Procedure Laterality Date   ABDOMINAL HYSTERECTOMY  1986   partial   COLONOSCOPY     cyst removed right hand     ganglion x 2   EVALUATION UNDER ANESTHESIA WITH HEMORRHOIDECTOMY N/A 07/21/2020   Procedure: EXAM UNDER ANESTHESIA WITH HEMORRHOIDECTOMY WITH LIGATION AND HEMORRHOIDOPEXY; REMOVAL OF PROLAPSING RECTAL TISSUE;  Surgeon: Karie Soda, MD;  Location: [facility] [place];  Service: General;   Laterality: N/A;   HEMORRHOID SURGERY  01/20/2021   Procedure: HEMORRHOIDECTOMY;  Surgeon: Karie Soda, MD;  Location: [facility];  Service: General;;   JOINT REPLACEMENT     KNEE ARTHROSCOPY WITH MEDIAL MENISECTOMY  12/30/2011   Procedure: KNEE ARTHROSCOPY WITH MEDIAL MENISECTOMY;  Surgeon: Eulas Post, MD;  Location: [facility];  Service: Orthopedics;  Laterality: Right;  RIGHT KNEE SCOPE Partial MEDIAL MENISCECTOMY   PARTIAL KNEE ARTHROPLASTY Right 01/01/2013   Procedure: UNICOMPARTMENTAL KNEE;  Surgeon: Eulas Post, MD;  Location: [facility];  Service: Orthopedics;  Laterality: Right;   PARTIAL PROCTECTOMY BY TEM N/A 01/20/2021   Procedure: PARTIAL PROCTECTOMY BY TEM;  Surgeon: Karie Soda, MD;  Location: [facility];  Service: General;  Laterality: N/A;   TOTAL HIP ARTHROPLASTY Right 02/13/2018   Procedure: TOTAL HIP ARTHROPLASTY;  Surgeon: Teryl Lucy, MD;  Location: [facility];  Service: Orthopedics;  Laterality: Right;   TRIGEMINAL NERVE DECOMPRESSION  04/2004   TUBAL LIGATION      Family History  Problem Relation Age of Onset   Anesthesia problems Daughter        post-op N/V   Arthritis Mother    Asthma Mother    Heart disease Mother    Diabetes Father    Stroke Father     Social History   Socioeconomic History   Marital status: Married    Spouse name: Not on file   Number of children: Not on file   Years of education: Not on file   Highest education level: 12th grade  Occupational History   Not on file  Tobacco Use   Smoking status: Never   Smokeless tobacco: Never  Vaping Use   Vaping status: Never Used  Substance and Sexual Activity   Alcohol use: No   Drug use: Never   Sexual activity: Yes    Birth control/protection: Surgical  Other Topics Concern   Not on file  Social History Narrative   Not on file   Social Determinants of Health   Financial Resource Strain: Low Risk  (08/11/2022)   Overall Financial Resource Strain (CARDIA)     Difficulty of Paying Living Expenses: Not hard at all  Food Insecurity: No Food Insecurity (08/11/2022)   Hunger Vital Sign    Worried About Running Out of Food in the Last Year: Never true    Ran Out of Food in the Last Year: Never true  Transportation Needs: No Transportation Needs (08/11/2022)   PRAPARE - Administrator, Civil Service (Medical): No    Lack of Transportation (Non-Medical): No  Physical Activity: Sufficiently Active (08/11/2022)   Exercise Vital Sign    Days of Exercise per Week: 7 days    Minutes of Exercise per Session: 30 min  Recent Concern: Physical Activity - Insufficiently Active (08/11/2022)   Exercise Vital Sign    Days of Exercise per Week:  3 days    Minutes of Exercise per Session: 20 min  Stress: No Stress Concern Present (08/11/2022)   [employer] of Occupational Health - Occupational Stress Questionnaire    Feeling of Stress : Not at all  Social Connections: Socially Integrated (08/11/2022)   Social Connection and Isolation Panel [NHANES]    Frequency of Communication with Friends and Family: More than three times a week    Frequency of Social Gatherings with Friends and Family: Once a week    Attends Religious Services: More than 4 times per year    Active Member of [employer] or Organizations: Yes    Attends Engineer, structural: More than 4 times per year    Marital Status: Married  Catering manager Violence: Not At Risk (08/11/2022)   Humiliation, Afraid, Rape, and Kick questionnaire    Fear of Current or Ex-Partner: No    Emotionally Abused: No    Physically Abused: No    Sexually Abused: No    Review of Systems  Constitutional:  Negative for chills and fever.  Respiratory:  Negative for shortness of breath.   Cardiovascular:  Negative for chest pain.  Musculoskeletal:  Positive for joint pain.  Neurological:  Negative for headaches.        Objective    There were no vitals taken for this visit.  Physical  Exam Constitutional:      Appearance: Normal appearance.  HENT:     Head: Normocephalic and atraumatic.     Mouth/Throat:     Mouth: Mucous membranes are moist.     Pharynx: Oropharynx is clear.  Eyes:     Conjunctiva/sclera: Conjunctivae normal.  Cardiovascular:     Rate and Rhythm: Normal rate and regular rhythm.  Pulmonary:     Effort: Pulmonary effort is normal.     Breath sounds: Normal breath sounds.  Musculoskeletal:     Right lower leg: No edema.     Left lower leg: No edema.  Skin:    General: Skin is warm and dry.     Findings: Bruising present.  Neurological:     General: No focal deficit present.     Mental Status: She is alert. Mental status is at baseline.  Psychiatric:        Mood and Affect: Mood normal.        Behavior: Behavior normal.         Assessment & Plan:   1. Essential (primary) hypertension: Chronic and stable.  Blood work due today.  Continue losartan-HCTZ 50-12.5 mg daily, refilled today.  Follow-up in 6 months.  - CBC w/Diff/Platelet - COMPLETE METABOLIC PANEL WITH GFR - losartan-hydrochlorothiazide (HYZAAR) 50-12.5 MG tablet; Take 1 tablet by mouth daily.  Dispense: 90 tablet; Refill: 1  2. Mixed hyperlipidemia: Due for fasting labs today.  Continue Crestor 10 mg, refilled.  - Lipid Profile - rosuvastatin (CRESTOR) 10 MG tablet; Take 1 tablet (10 mg total) by mouth daily.  Dispense: 90 tablet; Refill: 1  3. HSV-1 infection: Stable, currently taking Valtrex 500 mg daily for HSV prophylaxis, refilled.  - valACYclovir (VALTREX) 1000 MG tablet; Take 0.5 tablets (500 mg total) by mouth daily.  Dispense: 90 tablet; Refill: 1  4. Vasomotor symptoms due to menopause: Well-controlled, continue Effexor 37.5 mg daily, refilled.  - venlafaxine XR (EFFEXOR-XR) 37.5 MG 24 hr capsule; Take 1 capsule (37.5 mg total) by mouth daily.  Dispense: 90 capsule; Refill: 1  5. Arthritis: Discontinue Voltaren and Aleve and try Celebrex 50 mg  as needed for  pain instead.  Check kidney function today.  Patient counseled to take medication with food and avoid all other anti-inflammatories.  - celecoxib (CELEBREX) 50 MG capsule; Take 1 capsule (50 mg total) by mouth daily as needed for pain.  Dispense: 30 capsule; Refill: 0  6. Need for hepatitis C screening test: Screening due today.  - Hepatitis C Antibody  7. Vaccine for streptococcus pneumoniae and influenza: Prevnar 20 vaccine administered.  - Pneumococcal conjugate vaccine 20-valent (Prevnar 20)   No follow-ups on file.   Margarita Mail, DO

## 2022-10-13 ENCOUNTER — Ambulatory Visit (INDEPENDENT_AMBULATORY_CARE_PROVIDER_SITE_OTHER): Payer: Medicare Other | Admitting: Internal Medicine

## 2022-10-13 ENCOUNTER — Encounter: Payer: Self-pay | Admitting: Internal Medicine

## 2022-10-13 VITALS — BP 122/82 | HR 89 | Temp 98.1°F | Resp 16 | Ht 64.5 in | Wt 165.4 lb

## 2022-10-13 DIAGNOSIS — Z1231 Encounter for screening mammogram for malignant neoplasm of breast: Secondary | ICD-10-CM | POA: Diagnosis not present

## 2022-10-13 DIAGNOSIS — E782 Mixed hyperlipidemia: Secondary | ICD-10-CM

## 2022-10-13 DIAGNOSIS — Z23 Encounter for immunization: Secondary | ICD-10-CM | POA: Diagnosis not present

## 2022-10-13 DIAGNOSIS — B009 Herpesviral infection, unspecified: Secondary | ICD-10-CM

## 2022-10-13 DIAGNOSIS — M1990 Unspecified osteoarthritis, unspecified site: Secondary | ICD-10-CM | POA: Diagnosis not present

## 2022-10-13 DIAGNOSIS — I10 Essential (primary) hypertension: Secondary | ICD-10-CM | POA: Diagnosis not present

## 2022-10-13 DIAGNOSIS — Z13820 Encounter for screening for osteoporosis: Secondary | ICD-10-CM

## 2022-10-13 DIAGNOSIS — N951 Menopausal and female climacteric states: Secondary | ICD-10-CM

## 2022-10-13 MED ORDER — DICLOFENAC SODIUM 50 MG PO TBEC: 50.0000 mg | DELAYED_RELEASE_TABLET | Freq: Every day | ORAL | 1 refills | Status: DC | PRN

## 2022-10-13 MED ORDER — LOSARTAN POTASSIUM-HCTZ 50-12.5 MG PO TABS
1.0000 | ORAL_TABLET | Freq: Every day | ORAL | 1 refills | Status: DC
Start: 2022-10-13 — End: 2023-04-13

## 2022-10-13 MED ORDER — VENLAFAXINE HCL ER 75 MG PO CP24
75.0000 mg | ORAL_CAPSULE | Freq: Every day | ORAL | 1 refills | Status: DC
Start: 2022-10-13 — End: 2023-04-06

## 2022-10-13 MED ORDER — VALACYCLOVIR HCL 1 G PO TABS
500.0000 mg | ORAL_TABLET | Freq: Every day | ORAL | 1 refills | Status: DC
Start: 2022-10-13 — End: 2023-04-13

## 2022-10-13 MED ORDER — ROSUVASTATIN CALCIUM 10 MG PO TABS
10.0000 mg | ORAL_TABLET | Freq: Every day | ORAL | 1 refills | Status: DC
Start: 2022-10-13 — End: 2023-04-13

## 2022-10-13 NOTE — Patient Instructions (Signed)
It was great seeing you today!  Plan discussed at today's visit: -Please bring blood work done by surgeon's office for pre-op visit  -Increase Effexor to 75 mg -Diclofenac prescribed for arthritis - take with food  Follow up in: 6 months for fasting labs  Take care and let us know if you have any questions or concerns prior to your next visit.  Dr. Caralee Ates

## 2022-10-17 ENCOUNTER — Ambulatory Visit: Payer: Medicare Other | Admitting: Physician Assistant

## 2022-10-17 ENCOUNTER — Other Ambulatory Visit: Payer: Self-pay | Admitting: Orthopedic Surgery

## 2022-10-17 ENCOUNTER — Encounter: Payer: Self-pay | Admitting: Internal Medicine

## 2022-10-17 VITALS — BP 146/86 | HR 90 | Temp 97.5°F | Resp 16 | Ht 64.5 in | Wt 167.5 lb

## 2022-10-17 DIAGNOSIS — R319 Hematuria, unspecified: Secondary | ICD-10-CM | POA: Diagnosis not present

## 2022-10-17 DIAGNOSIS — Z01818 Encounter for other preprocedural examination: Secondary | ICD-10-CM

## 2022-10-17 DIAGNOSIS — R300 Dysuria: Secondary | ICD-10-CM

## 2022-10-17 DIAGNOSIS — N390 Urinary tract infection, site not specified: Secondary | ICD-10-CM

## 2022-10-17 MED ORDER — PHENAZOPYRIDINE HCL 100 MG PO TABS
100.0000 mg | ORAL_TABLET | Freq: Three times a day (TID) | ORAL | 0 refills | Status: DC | PRN
Start: 2022-10-17 — End: 2022-11-09

## 2022-10-17 MED ORDER — SULFAMETHOXAZOLE-TRIMETHOPRIM 800-160 MG PO TABS
1.0000 | ORAL_TABLET | Freq: Two times a day (BID) | ORAL | 0 refills | Status: AC
Start: 2022-10-17 — End: 2022-10-22

## 2022-10-17 NOTE — Patient Instructions (Addendum)
Based on your symptoms and results of the urinalysis I believe you have a UTI I recommend the following:  I have sent in a script for Bactrim to be taken by mouth twice per day for 5 days   Please finish the entire course of the antibiotic even if you are feeling better before it is completed. Stay well hydrated (at least 75 oz of water per day) and avoid holding your urine If you have any of the following please let us know: persistent symptoms, fever, trouble urinating or inability to urinate, confusion, flank pain.   It was nice to meet you and I appreciate the opportunity to be involved in your care If you were satisfied with the care you received from me, I would greatly appreciate you saying so in the after-visit survey that is sent out following our visit.

## 2022-10-17 NOTE — Progress Notes (Signed)
Acute Office Visit   Patient: Laura Wright   DOB: 04-11-1950   72 y.o. Female  MRN: [id number] Visit Date: 10/17/2022  Today's healthcare provider: Oswaldo Conroy Bryna Razavi, PA-C  Introduced myself to the patient as a Secondary school teacher and provided education on APPs in clinical practice.    Chief Complaint  Patient presents with   Hematuria   Dysuria    Started today and took Azo in the morning, unable to dipstick due to pee being orange and flag our dispstick   Subjective    HPI HPI     Dysuria    Additional comments: Started today and took Azo in the morning, unable to dipstick due to pee being orange and flag our dispstick      Last edited by Forde Radon, CMA on 10/17/2022  3:18 PM.      Dysuria  Onset: sudden  Duration: started today  Associated symptoms: she reports having urinary pressure, hematuria, dysuria, she reports some blood on toilet paper when wiping  She reports she does not have flank pain today but did have some last week   Interventions: she has tried AZO this AM   Medications: Outpatient Medications Prior to Visit  Medication Sig   Calcium-Magnesium-Vitamin D (CALCIUM 1200+D3 PO) Take 1 tablet by mouth daily.   diclofenac (VOLTAREN) 50 MG EC tablet Take 1 tablet (50 mg total) by mouth daily as needed for moderate pain.   glucosamine-chondroitin 500-400 MG tablet Take 1 tablet by mouth in the morning and at bedtime.   LORazepam (ATIVAN) 0.5 MG tablet Take 0.5 mg by mouth daily as needed for anxiety.   losartan-hydrochlorothiazide (HYZAAR) 50-12.5 MG tablet Take 1 tablet by mouth daily.   meclizine (ANTIVERT) 25 MG tablet Take 1 tablet (25 mg total) by mouth 3 (three) times daily as needed for dizziness.   Multiple Vitamin (MULTIVITAMIN) tablet Take 1 tablet by mouth daily.   Omega-3 1000 MG CAPS Take 1,000 mg by mouth in the morning and at bedtime.   rosuvastatin (CRESTOR) 10 MG tablet Take 1 tablet (10 mg total) by mouth daily.   valACYclovir  (VALTREX) 1000 MG tablet Take 0.5 tablets (500 mg total) by mouth daily.   venlafaxine XR (EFFEXOR XR) 75 MG 24 hr capsule Take 1 capsule (75 mg total) by mouth daily with breakfast.   No facility-administered medications prior to visit.    Review of Systems  Constitutional:  Negative for chills, fatigue and fever.  Genitourinary:  Positive for dysuria, frequency, hematuria and urgency. Negative for decreased urine volume, difficulty urinating, flank pain, vaginal bleeding, vaginal discharge and vaginal pain.        Objective    BP (!) 146/86   Pulse 90   Temp (!) 97.5 F (36.4 C) (Oral)   Resp 16   Ht 5' 4.5" (1.638 m)   Wt 167 lb 8 oz (76 kg)   SpO2 96%   BMI 28.31 kg/m     Physical Exam Vitals reviewed.  Constitutional:      General: She is awake.     Appearance: Normal appearance. She is well-developed and well-groomed.  HENT:     Head: Normocephalic and atraumatic.  Pulmonary:     Effort: Pulmonary effort is normal.  Abdominal:     General: Abdomen is flat. Bowel sounds are normal.     Palpations: Abdomen is soft.     Tenderness: There is no abdominal tenderness. There is right CVA tenderness.  There is no left CVA tenderness.  Musculoskeletal:     Cervical back: Normal range of motion.  Neurological:     Mental Status: She is alert.  Psychiatric:        Behavior: Behavior is cooperative.       No results found for any visits on 10/17/22.  Assessment & Plan      No follow-ups on file.      Problem List Items Addressed This Visit       Other   Hematuria   Relevant Orders   Urine Culture   Other Visit Diagnoses     Urinary tract infection with hematuria, site unspecified    -  Primary Acute, new problem Patient reports symptoms comprised of the following: dysuria, pressure, increased urinary frequency, urinary urgency since this morning  Unable to complete urine dip due to patient taking AZO- will send for culture Given dysuria, flank pain  will send in script for Bactrim PO BID x 5 days and Pyridium 100 mg PO TID PRN for discomfort Urine culture results to dictate further management discussed importance of finishing entire course of abx and staying well hydrated while recovering from UTI Reviewed ED precautions with patient Follow up as needed for persistent or worsening symptoms    Relevant Medications   sulfamethoxazole-trimethoprim (BACTRIM DS) 800-160 MG tablet   phenazopyridine (PYRIDIUM) 100 MG tablet   Dysuria       Relevant Medications   phenazopyridine (PYRIDIUM) 100 MG tablet   Other Relevant Orders   Urine Culture        No follow-ups on file.   I, Martez Weiand E Ferron Ishmael, PA-C, have reviewed all documentation for this visit. The documentation on 10/17/22 for the exam, diagnosis, procedures, and orders are all accurate and complete.   Jacquelin Hawking, MHS, PA-C [facility]

## 2022-10-18 LAB — URINE CULTURE
MICRO NUMBER:: 15591610
Result:: NO GROWTH
SPECIMEN QUALITY:: ADEQUATE

## 2022-10-21 ENCOUNTER — Ambulatory Visit
Admission: RE | Admit: 2022-10-21 | Discharge: 2022-10-21 | Disposition: A | Discharge status: Home / Self Care | Payer: Medicare Other | Source: Ambulatory Visit | Attending: Orthopedic Surgery | Admitting: Orthopedic Surgery

## 2022-10-21 ENCOUNTER — Encounter: Payer: Self-pay | Admitting: Physician Assistant

## 2022-10-21 DIAGNOSIS — Z01818 Encounter for other preprocedural examination: Secondary | ICD-10-CM

## 2022-10-21 DIAGNOSIS — M19012 Primary osteoarthritis, left shoulder: Secondary | ICD-10-CM | POA: Diagnosis not present

## 2022-10-21 NOTE — Progress Notes (Signed)
Your urine culture was negative for bacterial growth.  At this time I recommend stopping the antibiotic that you were given as it is not indicated since there is no UTI If you are still having discomfort with urination please let us know as this might be a sign of a kidney stone or other urinary process that we will need further workup Please let us know if you have further questions or concerns

## 2022-10-31 DIAGNOSIS — M25551 Pain in right hip: Secondary | ICD-10-CM | POA: Diagnosis not present

## 2022-10-31 DIAGNOSIS — M545 Low back pain, unspecified: Secondary | ICD-10-CM | POA: Diagnosis not present

## 2022-11-02 DIAGNOSIS — L570 Actinic keratosis: Secondary | ICD-10-CM | POA: Diagnosis not present

## 2022-11-02 DIAGNOSIS — Z85828 Personal history of other malignant neoplasm of skin: Secondary | ICD-10-CM | POA: Diagnosis not present

## 2022-11-02 DIAGNOSIS — L821 Other seborrheic keratosis: Secondary | ICD-10-CM | POA: Diagnosis not present

## 2022-11-02 DIAGNOSIS — W908XXS Exposure to other nonionizing radiation, sequela: Secondary | ICD-10-CM | POA: Diagnosis not present

## 2022-11-02 DIAGNOSIS — L820 Inflamed seborrheic keratosis: Secondary | ICD-10-CM | POA: Diagnosis not present

## 2022-11-02 DIAGNOSIS — L578 Other skin changes due to chronic exposure to nonionizing radiation: Secondary | ICD-10-CM | POA: Diagnosis not present

## 2022-11-02 DIAGNOSIS — D1801 Hemangioma of skin and subcutaneous tissue: Secondary | ICD-10-CM | POA: Diagnosis not present

## 2022-11-08 ENCOUNTER — Other Ambulatory Visit: Payer: Self-pay | Admitting: Internal Medicine

## 2022-11-08 DIAGNOSIS — N951 Menopausal and female climacteric states: Secondary | ICD-10-CM

## 2022-11-08 NOTE — Progress Notes (Unsigned)
Established Patient Office Visit  Subjective    Patient ID: Laura Wright, female    DOB: 01/10/1950  Age: 72 y.o. MRN: [id number]  CC:  Chief Complaint  Patient presents with   surgical clerance    HPI [name] presents for pre-op exam.  Type of Surgery: left shoulder replacement  Date of Surgery: 11/24/22 Location of Surgery: [facility]  Doctor Performing Surgery: Dr. Teryl Lucy Hx of surgical complications: None Hx of anesthesia complications: None Hx of MI/stenting, COPD, DM: None Hx of smoking? ETOH? Drug use?: None Revised Cardiac Risk Index for major cardiac event (RCRI): estimated perioperative risk calculated to be low at 0.18%. Pulmonary Risk Score (ARISCAT): 1.6% (low risk) of in-hospital postoperative pulmonary complications EKG: normal sinus rhythm  Labwork: creatinine 1.13, GFR 52   Outpatient Encounter Medications as of 11/09/2022  Medication Sig   Calcium-Magnesium-Vitamin D (CALCIUM 1200+D3 PO) Take 1 tablet by mouth daily.   diclofenac (VOLTAREN) 50 MG EC tablet Take 1 tablet (50 mg total) by mouth daily as needed for moderate pain.   glucosamine-chondroitin 500-400 MG tablet Take 1 tablet by mouth in the morning and at bedtime.   LORazepam (ATIVAN) 0.5 MG tablet Take 0.5 mg by mouth daily as needed for anxiety.   losartan-hydrochlorothiazide (HYZAAR) 50-12.5 MG tablet Take 1 tablet by mouth daily.   meclizine (ANTIVERT) 25 MG tablet Take 1 tablet (25 mg total) by mouth 3 (three) times daily as needed for dizziness.   Multiple Vitamin (MULTIVITAMIN) tablet Take 1 tablet by mouth daily.   Omega-3 1000 MG CAPS Take 1,000 mg by mouth in the morning and at bedtime.   rosuvastatin (CRESTOR) 10 MG tablet Take 1 tablet (10 mg total) by mouth daily.   valACYclovir (VALTREX) 1000 MG tablet Take 0.5 tablets (500 mg total) by mouth daily.   venlafaxine XR (EFFEXOR XR) 75 MG 24 hr capsule Take 1 capsule (75 mg total) by mouth daily with breakfast.    phenazopyridine (PYRIDIUM) 100 MG tablet Take 1 tablet (100 mg total) by mouth 3 (three) times daily as needed for pain.   No facility-administered encounter medications on file as of 11/09/2022.    Past Medical History:  Diagnosis Date   Anxiety    Cataract    Complex tear of medial meniscus of right knee as current injury 12/30/2011   COVID    had in Oct 2020, had h/a and runny nose for 1 week, lost taste and smell for several weeks. all s/s resolved   Dental crowns present    x 2   Family history of adverse reaction to anesthesia    daughter has PONV   Headache(784.0)    due to trigeminal neuralgia and trauma of surgery   Herpes simplex type 1 infection    Hyperlipidemia    Hypertension    dx'ed in last six months   Knee dislocation 12/2011   right   Localized osteoarthritis of right knee 12/30/2011   Osteoporosis    Primary localized osteoarthritis of right hip 02/13/2018   Trigeminal neuralgia    Wears glasses    as stated on July 20, 2020    Past Surgical History:  Procedure Laterality Date   ABDOMINAL HYSTERECTOMY  1986   partial   COLONOSCOPY     cyst removed right hand     ganglion x 2   EVALUATION UNDER ANESTHESIA WITH HEMORRHOIDECTOMY N/A 07/21/2020   Procedure: EXAM UNDER ANESTHESIA WITH HEMORRHOIDECTOMY WITH LIGATION AND HEMORRHOIDOPEXY; REMOVAL OF  PROLAPSING RECTAL TISSUE;  Surgeon: Karie Soda, MD;  Location: [facility];  Service: General;  Laterality: N/A;   HEMORRHOID SURGERY  01/20/2021   Procedure: HEMORRHOIDECTOMY;  Surgeon: Karie Soda, MD;  Location: [facility];  Service: General;;   JOINT REPLACEMENT     KNEE ARTHROSCOPY WITH MEDIAL MENISECTOMY  12/30/2011   Procedure: KNEE ARTHROSCOPY WITH MEDIAL MENISECTOMY;  Surgeon: Eulas Post, MD;  Location: [facility];  Service: Orthopedics;  Laterality: Right;  RIGHT KNEE SCOPE Partial MEDIAL MENISCECTOMY   PARTIAL KNEE ARTHROPLASTY Right 01/01/2013   Procedure:  UNICOMPARTMENTAL KNEE;  Surgeon: Eulas Post, MD;  Location: [facility];  Service: Orthopedics;  Laterality: Right;   PARTIAL PROCTECTOMY BY TEM N/A 01/20/2021   Procedure: PARTIAL PROCTECTOMY BY TEM;  Surgeon: Karie Soda, MD;  Location: [facility];  Service: General;  Laterality: N/A;   TOTAL HIP ARTHROPLASTY Right 02/13/2018   Procedure: TOTAL HIP ARTHROPLASTY;  Surgeon: Teryl Lucy, MD;  Location: [facility];  Service: Orthopedics;  Laterality: Right;   TRIGEMINAL NERVE DECOMPRESSION  04/2004   TUBAL LIGATION      Family History  Problem Relation Age of Onset   Anesthesia problems Daughter        post-op N/V   Arthritis Mother    Asthma Mother    Heart disease Mother    Diabetes Father    Stroke Father     Social History   Socioeconomic History   Marital status: Married    Spouse name: Not on file   Number of children: Not on file   Years of education: Not on file   Highest education level: 12th grade  Occupational History   Not on file  Tobacco Use   Smoking status: Never   Smokeless tobacco: Never  Vaping Use   Vaping status: Never Used  Substance and Sexual Activity   Alcohol use: No   Drug use: Never   Sexual activity: Yes    Birth control/protection: Surgical  Other Topics Concern   Not on file  Social History Narrative   Not on file   Social Determinants of Health   Financial Resource Strain: Low Risk  (10/17/2022)   Overall Financial Resource Strain (CARDIA)    Difficulty of Paying Living Expenses: Not hard at all  Food Insecurity: No Food Insecurity (10/17/2022)   Hunger Vital Sign    Worried About Running Out of Food in the Last Year: Never true    Ran Out of Food in the Last Year: Never true  Transportation Needs: No Transportation Needs (10/17/2022)   PRAPARE - Administrator, Civil Service (Medical): No    Lack of Transportation (Non-Medical): No  Physical Activity: Insufficiently Active (10/17/2022)   Exercise Vital Sign    Days of  Exercise per Week: 1 day    Minutes of Exercise per Session: 20 min  Stress: No Stress Concern Present (10/17/2022)   [employer] of Occupational Health - Occupational Stress Questionnaire    Feeling of Stress : Not at all  Social Connections: Socially Integrated (10/17/2022)   Social Connection and Isolation Panel [NHANES]    Frequency of Communication with Friends and Family: Twice a week    Frequency of Social Gatherings with Friends and Family: Once a week    Attends Religious Services: More than 4 times per year    Active Member of [employer] or Organizations: Yes    Attends Banker Meetings: More than 4 times per year  Marital Status: Married  Catering manager Violence: Not At Risk (08/11/2022)   Humiliation, Afraid, Rape, and Kick questionnaire    Fear of Current or Ex-Partner: No    Emotionally Abused: No    Physically Abused: No    Sexually Abused: No    Review of Systems  Constitutional:  Negative for chills and fever.  Respiratory:  Negative for shortness of breath.   Cardiovascular:  Negative for chest pain.  Genitourinary:  Negative for dysuria, frequency, hematuria and urgency.  Musculoskeletal:  Positive for joint pain.  Neurological:  Negative for headaches.        Objective    BP 122/84   Pulse 95   Temp 97.8 F (36.6 C) (Oral)   Resp 16   Ht 5' 4.5" (1.638 m)   Wt 166 lb 12.8 oz (75.7 kg)   SpO2 97%   BMI 28.19 kg/m   Physical Exam Constitutional:      Appearance: Normal appearance.  HENT:     Head: Normocephalic and atraumatic.  Eyes:     Conjunctiva/sclera: Conjunctivae normal.  Cardiovascular:     Rate and Rhythm: Normal rate and regular rhythm.  Pulmonary:     Effort: Pulmonary effort is normal.     Breath sounds: Normal breath sounds.  Skin:    General: Skin is warm and dry.  Neurological:     General: No focal deficit present.     Mental Status: She is alert. Mental status is at baseline.  Psychiatric:        Mood  and Affect: Mood normal.        Behavior: Behavior normal.       Assessment & Plan:   1. Pre-op evaluation/Osteoarthritis of left shoulder, unspecified osteoarthritis type: Reviewed labs done on 10/06/22; normal with exception of GFR slightly low at 52. EKG here normal sinus rhythm. This patient is low pulmonary risk and low cardiac risk and may proceed with procedure as previously planned.  - EKG 12-Lead  Return for already scheduled.   Margarita Mail, DO

## 2022-11-09 ENCOUNTER — Other Ambulatory Visit: Payer: Self-pay

## 2022-11-09 ENCOUNTER — Encounter: Payer: Self-pay | Admitting: Internal Medicine

## 2022-11-09 ENCOUNTER — Ambulatory Visit (INDEPENDENT_AMBULATORY_CARE_PROVIDER_SITE_OTHER): Payer: Medicare Other | Admitting: Internal Medicine

## 2022-11-09 VITALS — BP 122/84 | HR 95 | Temp 97.8°F | Resp 16 | Ht 64.5 in | Wt 166.8 lb

## 2022-11-09 DIAGNOSIS — M19012 Primary osteoarthritis, left shoulder: Secondary | ICD-10-CM

## 2022-11-09 DIAGNOSIS — Z01818 Encounter for other preprocedural examination: Secondary | ICD-10-CM

## 2022-11-09 NOTE — Telephone Encounter (Signed)
Refused Effexor-XL 37.5 mg because it was discontinued on 10/13/2022 due to a dose change.

## 2022-11-15 DIAGNOSIS — M25551 Pain in right hip: Secondary | ICD-10-CM | POA: Diagnosis not present

## 2022-11-28 DIAGNOSIS — G8918 Other acute postprocedural pain: Secondary | ICD-10-CM | POA: Diagnosis not present

## 2022-11-28 DIAGNOSIS — M898X1 Other specified disorders of bone, shoulder: Secondary | ICD-10-CM | POA: Diagnosis not present

## 2022-11-28 DIAGNOSIS — M25712 Osteophyte, left shoulder: Secondary | ICD-10-CM | POA: Diagnosis not present

## 2022-11-28 DIAGNOSIS — M948X1 Other specified disorders of cartilage, shoulder: Secondary | ICD-10-CM | POA: Diagnosis not present

## 2022-11-28 DIAGNOSIS — M19012 Primary osteoarthritis, left shoulder: Secondary | ICD-10-CM | POA: Diagnosis not present

## 2022-12-06 DIAGNOSIS — M19012 Primary osteoarthritis, left shoulder: Secondary | ICD-10-CM | POA: Diagnosis not present

## 2022-12-09 ENCOUNTER — Ambulatory Visit: Payer: Medicare Other | Admitting: Internal Medicine

## 2023-01-06 DIAGNOSIS — M19012 Primary osteoarthritis, left shoulder: Secondary | ICD-10-CM | POA: Diagnosis not present

## 2023-01-09 DIAGNOSIS — M19012 Primary osteoarthritis, left shoulder: Secondary | ICD-10-CM | POA: Diagnosis not present

## 2023-01-09 DIAGNOSIS — M6281 Muscle weakness (generalized): Secondary | ICD-10-CM | POA: Diagnosis not present

## 2023-01-09 DIAGNOSIS — S46012D Strain of muscle(s) and tendon(s) of the rotator cuff of left shoulder, subsequent encounter: Secondary | ICD-10-CM | POA: Diagnosis not present

## 2023-01-09 DIAGNOSIS — M25612 Stiffness of left shoulder, not elsewhere classified: Secondary | ICD-10-CM | POA: Diagnosis not present

## 2023-01-11 DIAGNOSIS — M19012 Primary osteoarthritis, left shoulder: Secondary | ICD-10-CM | POA: Diagnosis not present

## 2023-01-11 DIAGNOSIS — M6281 Muscle weakness (generalized): Secondary | ICD-10-CM | POA: Diagnosis not present

## 2023-01-11 DIAGNOSIS — M25612 Stiffness of left shoulder, not elsewhere classified: Secondary | ICD-10-CM | POA: Diagnosis not present

## 2023-01-11 DIAGNOSIS — S46012D Strain of muscle(s) and tendon(s) of the rotator cuff of left shoulder, subsequent encounter: Secondary | ICD-10-CM | POA: Diagnosis not present

## 2023-01-13 ENCOUNTER — Encounter: Payer: Self-pay | Admitting: Internal Medicine

## 2023-01-13 ENCOUNTER — Telehealth (INDEPENDENT_AMBULATORY_CARE_PROVIDER_SITE_OTHER): Payer: Medicare Other | Admitting: Internal Medicine

## 2023-01-13 DIAGNOSIS — J40 Bronchitis, not specified as acute or chronic: Secondary | ICD-10-CM | POA: Diagnosis not present

## 2023-01-13 MED ORDER — AMOXICILLIN-POT CLAVULANATE 875-125 MG PO TABS: 1.0000 | ORAL_TABLET | Freq: Two times a day (BID) | ORAL | 0 refills | Status: AC

## 2023-01-13 NOTE — Progress Notes (Signed)
 Virtual Visit via Video Note  I connected with Laura Wright on 01/13/23 at  3:40 PM EST by a video enabled telemedicine application and verified that I am speaking with the correct person using two identifiers.  Location: Patient: Home Provider: Home   I discussed the limitations of evaluation and management by telemedicine and the availability of in person appointments. The patient expressed understanding and agreed to proceed.  History of Present Illness:  Patient is presenting via telemedicine to discuss cold symptoms. Symptoms just started as a cough, now more deep for the last few days. Started PT this week, uncertain if she caught something there.  URI Compliant:  -Worst symptom: cough -Fever: no -Cough: yes, productive with green mucus -Shortness of breath: no -Wheezing: no -Chest congestion: yes -Nasal congestion: no -Runny nose: yes, clear -Post nasal drip: yes -Sore throat: yes -Sinus pressure: no -Headache: no -Face pain: no -Toothache: no -Ear pain: no  -Ear pressure: no  -Sick contacts:  none that she knows of -Context: worse -Relief with OTC cold/cough medications: no  -Treatments attempted: cold/sinus   Observations/Objective:  General: well appearing, no acute distress ENT: conjunctiva normal appearing bilaterally, throat clear, no congestion Pulm: occasional hacking cough heard Neuro: answers all questions appropriately   Assessment and Plan:  1. Bronchitis (Primary): Recommend taking a home COVID test as she is within the timeframe for treatment. Cough progressively worsening, had about 5 days. Will go ahead and treat with an antibiotic. Continue over the counter cough suppressants, can try nasal steroids as well for PND and follow up if symptoms worsen or fail to improve.   - amoxicillin -clavulanate (AUGMENTIN ) 875-125 MG tablet; Take 1 tablet by mouth 2 (two) times daily for 5 days.  Dispense: 10 tablet; Refill: 0   Follow Up Instructions:  PRN    I discussed the assessment and treatment plan with the patient. The patient was provided an opportunity to ask questions and all were answered. The patient agreed with the plan and demonstrated an understanding of the instructions.   The patient was advised to call back or seek an in-person evaluation if the symptoms worsen or if the condition fails to improve as anticipated.  I provided 7 minutes of non-face-to-face time during this encounter.   Laura Fischer, DO

## 2023-01-17 DIAGNOSIS — M25612 Stiffness of left shoulder, not elsewhere classified: Secondary | ICD-10-CM | POA: Diagnosis not present

## 2023-01-17 DIAGNOSIS — M19012 Primary osteoarthritis, left shoulder: Secondary | ICD-10-CM | POA: Diagnosis not present

## 2023-01-17 DIAGNOSIS — S46012D Strain of muscle(s) and tendon(s) of the rotator cuff of left shoulder, subsequent encounter: Secondary | ICD-10-CM | POA: Diagnosis not present

## 2023-01-17 DIAGNOSIS — M6281 Muscle weakness (generalized): Secondary | ICD-10-CM | POA: Diagnosis not present

## 2023-01-18 ENCOUNTER — Encounter: Payer: Self-pay | Admitting: Internal Medicine

## 2023-01-19 DIAGNOSIS — S46012D Strain of muscle(s) and tendon(s) of the rotator cuff of left shoulder, subsequent encounter: Secondary | ICD-10-CM | POA: Diagnosis not present

## 2023-01-19 DIAGNOSIS — M19012 Primary osteoarthritis, left shoulder: Secondary | ICD-10-CM | POA: Diagnosis not present

## 2023-01-19 DIAGNOSIS — M6281 Muscle weakness (generalized): Secondary | ICD-10-CM | POA: Diagnosis not present

## 2023-01-19 DIAGNOSIS — M25612 Stiffness of left shoulder, not elsewhere classified: Secondary | ICD-10-CM | POA: Diagnosis not present

## 2023-01-24 DIAGNOSIS — M19012 Primary osteoarthritis, left shoulder: Secondary | ICD-10-CM | POA: Diagnosis not present

## 2023-01-24 DIAGNOSIS — M25612 Stiffness of left shoulder, not elsewhere classified: Secondary | ICD-10-CM | POA: Diagnosis not present

## 2023-01-24 DIAGNOSIS — M6281 Muscle weakness (generalized): Secondary | ICD-10-CM | POA: Diagnosis not present

## 2023-01-24 DIAGNOSIS — S46012D Strain of muscle(s) and tendon(s) of the rotator cuff of left shoulder, subsequent encounter: Secondary | ICD-10-CM | POA: Diagnosis not present

## 2023-01-26 DIAGNOSIS — S46012D Strain of muscle(s) and tendon(s) of the rotator cuff of left shoulder, subsequent encounter: Secondary | ICD-10-CM | POA: Diagnosis not present

## 2023-01-26 DIAGNOSIS — M25612 Stiffness of left shoulder, not elsewhere classified: Secondary | ICD-10-CM | POA: Diagnosis not present

## 2023-01-26 DIAGNOSIS — M6281 Muscle weakness (generalized): Secondary | ICD-10-CM | POA: Diagnosis not present

## 2023-01-26 DIAGNOSIS — M19012 Primary osteoarthritis, left shoulder: Secondary | ICD-10-CM | POA: Diagnosis not present

## 2023-01-31 DIAGNOSIS — M19012 Primary osteoarthritis, left shoulder: Secondary | ICD-10-CM | POA: Diagnosis not present

## 2023-01-31 DIAGNOSIS — S46012D Strain of muscle(s) and tendon(s) of the rotator cuff of left shoulder, subsequent encounter: Secondary | ICD-10-CM | POA: Diagnosis not present

## 2023-01-31 DIAGNOSIS — M6281 Muscle weakness (generalized): Secondary | ICD-10-CM | POA: Diagnosis not present

## 2023-01-31 DIAGNOSIS — M25612 Stiffness of left shoulder, not elsewhere classified: Secondary | ICD-10-CM | POA: Diagnosis not present

## 2023-02-02 DIAGNOSIS — S46012D Strain of muscle(s) and tendon(s) of the rotator cuff of left shoulder, subsequent encounter: Secondary | ICD-10-CM | POA: Diagnosis not present

## 2023-02-02 DIAGNOSIS — M6281 Muscle weakness (generalized): Secondary | ICD-10-CM | POA: Diagnosis not present

## 2023-02-02 DIAGNOSIS — M25612 Stiffness of left shoulder, not elsewhere classified: Secondary | ICD-10-CM | POA: Diagnosis not present

## 2023-02-02 DIAGNOSIS — M19012 Primary osteoarthritis, left shoulder: Secondary | ICD-10-CM | POA: Diagnosis not present

## 2023-02-07 DIAGNOSIS — M19012 Primary osteoarthritis, left shoulder: Secondary | ICD-10-CM | POA: Diagnosis not present

## 2023-02-07 DIAGNOSIS — M25612 Stiffness of left shoulder, not elsewhere classified: Secondary | ICD-10-CM | POA: Diagnosis not present

## 2023-02-07 DIAGNOSIS — M6281 Muscle weakness (generalized): Secondary | ICD-10-CM | POA: Diagnosis not present

## 2023-02-07 DIAGNOSIS — S46012D Strain of muscle(s) and tendon(s) of the rotator cuff of left shoulder, subsequent encounter: Secondary | ICD-10-CM | POA: Diagnosis not present

## 2023-02-08 ENCOUNTER — Inpatient Hospital Stay
Admission: RE | Admit: 2023-02-08 | Discharge: 2023-02-08 | Disposition: A | Discharge status: Home / Self Care | Payer: Self-pay | Source: Ambulatory Visit | Attending: Internal Medicine

## 2023-02-08 ENCOUNTER — Ambulatory Visit
Admission: RE | Admit: 2023-02-08 | Discharge: 2023-02-08 | Disposition: A | Discharge status: Home / Self Care | Payer: Medicare Other | Source: Ambulatory Visit | Attending: Internal Medicine | Admitting: Internal Medicine

## 2023-02-08 ENCOUNTER — Inpatient Hospital Stay
Admission: RE | Admit: 2023-02-08 | Discharge: 2023-02-08 | Disposition: A | Discharge status: Home / Self Care | Payer: Self-pay | Source: Ambulatory Visit | Attending: Internal Medicine | Admitting: Internal Medicine

## 2023-02-08 ENCOUNTER — Other Ambulatory Visit: Payer: Self-pay | Admitting: *Deleted

## 2023-02-08 DIAGNOSIS — Z96653 Presence of artificial knee joint, bilateral: Secondary | ICD-10-CM | POA: Diagnosis not present

## 2023-02-08 DIAGNOSIS — Z13820 Encounter for screening for osteoporosis: Secondary | ICD-10-CM | POA: Diagnosis not present

## 2023-02-08 DIAGNOSIS — Z1231 Encounter for screening mammogram for malignant neoplasm of breast: Secondary | ICD-10-CM

## 2023-02-08 DIAGNOSIS — Z780 Asymptomatic menopausal state: Secondary | ICD-10-CM | POA: Insufficient documentation

## 2023-02-08 DIAGNOSIS — M85852 Other specified disorders of bone density and structure, left thigh: Secondary | ICD-10-CM | POA: Insufficient documentation

## 2023-02-08 DIAGNOSIS — Z96641 Presence of right artificial hip joint: Secondary | ICD-10-CM | POA: Diagnosis not present

## 2023-02-09 DIAGNOSIS — S46012D Strain of muscle(s) and tendon(s) of the rotator cuff of left shoulder, subsequent encounter: Secondary | ICD-10-CM | POA: Diagnosis not present

## 2023-02-09 DIAGNOSIS — M19012 Primary osteoarthritis, left shoulder: Secondary | ICD-10-CM | POA: Diagnosis not present

## 2023-02-09 DIAGNOSIS — M25612 Stiffness of left shoulder, not elsewhere classified: Secondary | ICD-10-CM | POA: Diagnosis not present

## 2023-02-09 DIAGNOSIS — M6281 Muscle weakness (generalized): Secondary | ICD-10-CM | POA: Diagnosis not present

## 2023-02-10 ENCOUNTER — Encounter: Payer: Self-pay | Admitting: Internal Medicine

## 2023-02-14 DIAGNOSIS — M25612 Stiffness of left shoulder, not elsewhere classified: Secondary | ICD-10-CM | POA: Diagnosis not present

## 2023-02-14 DIAGNOSIS — M6281 Muscle weakness (generalized): Secondary | ICD-10-CM | POA: Diagnosis not present

## 2023-02-14 DIAGNOSIS — M19012 Primary osteoarthritis, left shoulder: Secondary | ICD-10-CM | POA: Diagnosis not present

## 2023-02-14 DIAGNOSIS — S46012D Strain of muscle(s) and tendon(s) of the rotator cuff of left shoulder, subsequent encounter: Secondary | ICD-10-CM | POA: Diagnosis not present

## 2023-02-16 DIAGNOSIS — M19012 Primary osteoarthritis, left shoulder: Secondary | ICD-10-CM | POA: Diagnosis not present

## 2023-02-16 DIAGNOSIS — M25612 Stiffness of left shoulder, not elsewhere classified: Secondary | ICD-10-CM | POA: Diagnosis not present

## 2023-02-16 DIAGNOSIS — S46012D Strain of muscle(s) and tendon(s) of the rotator cuff of left shoulder, subsequent encounter: Secondary | ICD-10-CM | POA: Diagnosis not present

## 2023-02-16 DIAGNOSIS — M6281 Muscle weakness (generalized): Secondary | ICD-10-CM | POA: Diagnosis not present

## 2023-02-21 DIAGNOSIS — M19012 Primary osteoarthritis, left shoulder: Secondary | ICD-10-CM | POA: Diagnosis not present

## 2023-02-21 DIAGNOSIS — M6281 Muscle weakness (generalized): Secondary | ICD-10-CM | POA: Diagnosis not present

## 2023-02-21 DIAGNOSIS — S46012D Strain of muscle(s) and tendon(s) of the rotator cuff of left shoulder, subsequent encounter: Secondary | ICD-10-CM | POA: Diagnosis not present

## 2023-02-21 DIAGNOSIS — M25612 Stiffness of left shoulder, not elsewhere classified: Secondary | ICD-10-CM | POA: Diagnosis not present

## 2023-02-28 DIAGNOSIS — S46012D Strain of muscle(s) and tendon(s) of the rotator cuff of left shoulder, subsequent encounter: Secondary | ICD-10-CM | POA: Diagnosis not present

## 2023-02-28 DIAGNOSIS — M25612 Stiffness of left shoulder, not elsewhere classified: Secondary | ICD-10-CM | POA: Diagnosis not present

## 2023-02-28 DIAGNOSIS — M6281 Muscle weakness (generalized): Secondary | ICD-10-CM | POA: Diagnosis not present

## 2023-02-28 DIAGNOSIS — M19012 Primary osteoarthritis, left shoulder: Secondary | ICD-10-CM | POA: Diagnosis not present

## 2023-03-07 DIAGNOSIS — M19012 Primary osteoarthritis, left shoulder: Secondary | ICD-10-CM | POA: Diagnosis not present

## 2023-03-07 DIAGNOSIS — M6281 Muscle weakness (generalized): Secondary | ICD-10-CM | POA: Diagnosis not present

## 2023-03-07 DIAGNOSIS — M25612 Stiffness of left shoulder, not elsewhere classified: Secondary | ICD-10-CM | POA: Diagnosis not present

## 2023-03-07 DIAGNOSIS — S46012D Strain of muscle(s) and tendon(s) of the rotator cuff of left shoulder, subsequent encounter: Secondary | ICD-10-CM | POA: Diagnosis not present

## 2023-03-14 DIAGNOSIS — S46012D Strain of muscle(s) and tendon(s) of the rotator cuff of left shoulder, subsequent encounter: Secondary | ICD-10-CM | POA: Diagnosis not present

## 2023-03-14 DIAGNOSIS — M25612 Stiffness of left shoulder, not elsewhere classified: Secondary | ICD-10-CM | POA: Diagnosis not present

## 2023-03-14 DIAGNOSIS — M19012 Primary osteoarthritis, left shoulder: Secondary | ICD-10-CM | POA: Diagnosis not present

## 2023-03-14 DIAGNOSIS — M6281 Muscle weakness (generalized): Secondary | ICD-10-CM | POA: Diagnosis not present

## 2023-04-05 ENCOUNTER — Other Ambulatory Visit: Payer: Self-pay | Admitting: Internal Medicine

## 2023-04-05 DIAGNOSIS — M1990 Unspecified osteoarthritis, unspecified site: Secondary | ICD-10-CM

## 2023-04-05 DIAGNOSIS — N951 Menopausal and female climacteric states: Secondary | ICD-10-CM

## 2023-04-06 ENCOUNTER — Encounter: Payer: Self-pay | Admitting: Internal Medicine

## 2023-04-06 NOTE — Telephone Encounter (Signed)
 OV 11/09/22, 01/13/23 Requested Prescriptions  Pending Prescriptions Disp Refills   diclofenac (VOLTAREN) 50 MG EC tablet [Pharmacy Med Name: Diclofenac Sodium 50 MG Oral Tablet Delayed Release] 90 tablet 0    Sig: TAKE 1 TABLET BY MOUTH ONCE DAILY AS NEEDED FOR MODERATE PAIN     Analgesics:  NSAIDS Failed - 04/06/2023  2:21 PM      Failed - Manual Review: Labs are only required if the patient has taken medication for more than 8 weeks.      Failed - Cr in normal range and within 360 days    Creat  Date Value Ref Range Status  04/08/2022 0.90 0.60 - 1.00 mg/dL Final         Failed - HGB in normal range and within 360 days    Hemoglobin  Date Value Ref Range Status  04/08/2022 13.7 11.7 - 15.5 g/dL Final         Failed - PLT in normal range and within 360 days    Platelets  Date Value Ref Range Status  04/08/2022 329 140 - 400 Thousand/uL Final         Failed - HCT in normal range and within 360 days    HCT  Date Value Ref Range Status  04/08/2022 41.8 35.0 - 45.0 % Final         Failed - Valid encounter within last 12 months    Recent Outpatient Visits   None     Future Appointments             In 1 week Margarita Mail, DO [facility]            Passed - eGFR is 30 or above and within 360 days    GFR calc Af Amer  Date Value Ref Range Status  02/14/2018 >60 >60 mL/min Final   GFR, Estimated  Date Value Ref Range Status  01/07/2021 >60 >60 mL/min Final    Comment:    (NOTE) Calculated using the CKD-EPI Creatinine Equation (2021)    eGFR  Date Value Ref Range Status  04/08/2022 69 > OR = 60 mL/min/1.61m2 Final   EGFR  Date Value Ref Range Status  10/05/2022 52.0  Final    Comment:    Abstracted by HIM         Passed - Patient is not pregnant       venlafaxine XR (EFFEXOR-XR) 75 MG 24 hr capsule [Pharmacy Med Name: Venlafaxine HCl ER 75 MG Oral Capsule Extended Release 24 Hour] 90 capsule 0    Sig: TAKE 1 CAPSULE  BY MOUTH ONCE DAILY WITH BREAKFAST     Psychiatry: Antidepressants - SNRI - desvenlafaxine & venlafaxine Failed - 04/06/2023  2:21 PM      Failed - Cr in normal range and within 360 days    Creat  Date Value Ref Range Status  04/08/2022 0.90 0.60 - 1.00 mg/dL Final         Failed - Valid encounter within last 6 months    Recent Outpatient Visits   None     Future Appointments             In 1 week Margarita Mail, DO [facility] [place]            Failed - Lipid Panel in normal range within the last 12 months    Cholesterol  Date Value Ref Range Status  04/08/2022 142 <200 mg/dL Final  LDL Cholesterol (Calc)  Date Value Ref Range Status  04/08/2022 58 mg/dL (calc) Final    Comment:    Reference range: <100 . Desirable range <100 mg/dL for primary prevention;   <70 mg/dL for patients with CHD or diabetic patients  with > or = 2 CHD risk factors. Marland Kitchen LDL-C is now calculated using the Martin-Hopkins  calculation, which is a validated novel method providing  better accuracy than the Friedewald equation in the  estimation of LDL-C.  Horald Pollen et al. Lenox Ahr. 1610;960(45): 2061-2068  (http://education.[facility].com/faq/FAQ164)    HDL  Date Value Ref Range Status  04/08/2022 66 > OR = 50 mg/dL Final   Triglycerides  Date Value Ref Range Status  04/08/2022 94 <150 mg/dL Final         Passed - Last BP in normal range    BP Readings from Last 1 Encounters:  11/09/22 122/84

## 2023-04-13 ENCOUNTER — Ambulatory Visit (INDEPENDENT_AMBULATORY_CARE_PROVIDER_SITE_OTHER): Payer: Self-pay | Admitting: Internal Medicine

## 2023-04-13 ENCOUNTER — Other Ambulatory Visit: Payer: Self-pay

## 2023-04-13 ENCOUNTER — Encounter: Payer: Self-pay | Admitting: Internal Medicine

## 2023-04-13 VITALS — BP 128/72 | HR 92 | Temp 97.8°F | Resp 16 | Ht 64.5 in | Wt 171.6 lb

## 2023-04-13 DIAGNOSIS — I10 Essential (primary) hypertension: Secondary | ICD-10-CM

## 2023-04-13 DIAGNOSIS — M1990 Unspecified osteoarthritis, unspecified site: Secondary | ICD-10-CM | POA: Insufficient documentation

## 2023-04-13 DIAGNOSIS — B009 Herpesviral infection, unspecified: Secondary | ICD-10-CM | POA: Diagnosis not present

## 2023-04-13 DIAGNOSIS — E559 Vitamin D deficiency, unspecified: Secondary | ICD-10-CM | POA: Diagnosis not present

## 2023-04-13 DIAGNOSIS — E782 Mixed hyperlipidemia: Secondary | ICD-10-CM | POA: Insufficient documentation

## 2023-04-13 DIAGNOSIS — R635 Abnormal weight gain: Secondary | ICD-10-CM

## 2023-04-13 DIAGNOSIS — N951 Menopausal and female climacteric states: Secondary | ICD-10-CM

## 2023-04-13 LAB — CBC WITH DIFFERENTIAL/PLATELET
Absolute Lymphocytes: 1891 {cells}/uL (ref 850–3900)
Absolute Monocytes: 568 {cells}/uL (ref 200–950)
Basophils Absolute: 52 {cells}/uL (ref 0–200)
Basophils Relative: 0.9 %
Eosinophils Absolute: 110 {cells}/uL (ref 15–500)
Eosinophils Relative: 1.9 %
HCT: 41.7 % (ref 35.0–45.0)
Hemoglobin: 13.4 g/dL (ref 11.7–15.5)
MCH: 27.7 pg (ref 27.0–33.0)
MCHC: 32.1 g/dL (ref 32.0–36.0)
MCV: 86.3 fL (ref 80.0–100.0)
MPV: 9.9 fL (ref 7.5–12.5)
Monocytes Relative: 9.8 %
Neutro Abs: 3178 {cells}/uL (ref 1500–7800)
Neutrophils Relative %: 54.8 %
Platelets: 346 10*3/uL (ref 140–400)
RBC: 4.83 10*6/uL (ref 3.80–5.10)
RDW: 13.3 % (ref 11.0–15.0)
Total Lymphocyte: 32.6 %
WBC: 5.8 10*3/uL (ref 3.8–10.8)

## 2023-04-13 LAB — COMPLETE METABOLIC PANEL WITHOUT GFR
AG Ratio: 2 (calc) (ref 1.0–2.5)
ALT: 13 U/L (ref 6–29)
AST: 18 U/L (ref 10–35)
Albumin: 4.3 g/dL (ref 3.6–5.1)
Alkaline phosphatase (APISO): 78 U/L (ref 37–153)
BUN: 14 mg/dL (ref 7–25)
CO2: 31 mmol/L (ref 20–32)
Calcium: 9.5 mg/dL (ref 8.6–10.4)
Chloride: 98 mmol/L (ref 98–110)
Creat: 0.82 mg/dL (ref 0.60–1.00)
Globulin: 2.2 g/dL (ref 1.9–3.7)
Glucose, Bld: 91 mg/dL (ref 65–99)
Potassium: 4.1 mmol/L (ref 3.5–5.3)
Sodium: 136 mmol/L (ref 135–146)
Total Bilirubin: 0.7 mg/dL (ref 0.2–1.2)
Total Protein: 6.5 g/dL (ref 6.1–8.1)

## 2023-04-13 LAB — LIPID PANEL
Cholesterol: 142 mg/dL (ref <200)
HDL: 59 mg/dL (ref >=50)
LDL Cholesterol (Calc): 62 mg/dL
Non-HDL Cholesterol (Calc): 83 mg/dL (ref <130)
Total CHOL/HDL Ratio: 2.4 (calc) (ref <5.0)
Triglycerides: 120 mg/dL (ref <150)

## 2023-04-13 LAB — VITAMIN D 25 HYDROXY (VIT D DEFICIENCY, FRACTURES): Vit D, 25-Hydroxy: 43 ng/mL (ref 30–100)

## 2023-04-13 LAB — TSH: TSH: 0.91 m[IU]/L (ref 0.40–4.50)

## 2023-04-13 MED ORDER — ROSUVASTATIN CALCIUM 10 MG PO TABS
10.0000 mg | ORAL_TABLET | Freq: Every day | ORAL | 1 refills | Status: DC
Start: 2023-04-13 — End: 2023-10-13

## 2023-04-13 MED ORDER — LOSARTAN POTASSIUM-HCTZ 50-12.5 MG PO TABS: 1.0000 | ORAL_TABLET | Freq: Every day | ORAL | 1 refills | Status: DC

## 2023-04-13 MED ORDER — VALACYCLOVIR HCL 1 G PO TABS
500.0000 mg | ORAL_TABLET | Freq: Every day | ORAL | 1 refills | Status: DC
Start: 2023-04-13 — End: 2023-10-13

## 2023-04-13 NOTE — Assessment & Plan Note (Signed)
 Stable, no recent flares. Refill Valtrex.

## 2023-04-13 NOTE — Assessment & Plan Note (Signed)
 Doing well on Effexor 75 mg, continue.

## 2023-04-13 NOTE — Assessment & Plan Note (Signed)
 Reviewed DEXA scan from earlier this year, osteopenia in left hip. Will recheck Vitamin D levels, not currently on supplements.

## 2023-04-13 NOTE — Assessment & Plan Note (Signed)
 Osteoarthritis, has had multiple joint replacements and following with Ortho. Currently taking Diclofenac as needed but cannot tolerate other anti-inflammatories.

## 2023-04-13 NOTE — Assessment & Plan Note (Signed)
 Recheck labs, continue statin.

## 2023-04-13 NOTE — Assessment & Plan Note (Signed)
 Blood pressure stable here today, no changes made to medications and appropriate refills sent to pharmacy. Labs due.

## 2023-04-13 NOTE — Progress Notes (Signed)
 Established Patient Office Visit  Subjective    Patient ID: Laura Wright, female    DOB: 12-30-1950  Age: 73 y.o. MRN: [id number]  CC:  Chief Complaint  Patient presents with   Medical Management of Chronic Issues    6 month recheck    HPI Laura Wright presents for follow up on chronic medical conditions. Patient states she is frustrated about weight gain but had a large surgery back in November which prevented her from exercising and being active. She gained about 8 pounds and has had a hard time losing it, despite walking about 10,000 -12,000 steps a day.   Hypertension: -Medications: Losartan-HCTZ 50-12.5 mg  -Patient is compliant with above medications and reports no side effects. -Checking BP at home (average): Does not check -Denies any SOB, CP, vision changes, LE edema or symptoms of hypotension  HLD: -Medications: Crestor 10 mg -Patient is compliant with above medications and reports no side effects.  -Last lipid panel:  Lipid Panel     Component Value Date/Time   CHOL 142 04/08/2022 0852   TRIG 94 04/08/2022 0852   HDL 66 04/08/2022 0852   CHOLHDL 2.2 04/08/2022 0852   LDLCALC 58 04/08/2022 0852    VMS/Hot Flashes: -Currently on Effexor 75 mg daily -History of hysterectomy in 1986 secondary to uterine fibroids, has not had any vaginal bleeding since that time  Hx of recurrent oral HSV: -Currently Valtrex 500 mg daily, has not had a flare in several years -Does occasionally get cold sores around her mouth, needs refills  Arthritis: -Has OA in multiple sites, had both knees replaced as well as hip and is planning on shoulder soon -Does have pain in hands and fingers -Had been taking Celebrex but caused some abdominal symptoms, now taking Diclofenac as needed, also has a prescription for voltaren -Thinking about restarting back injections again  Health Maintenance: -Blood work due -Mammogram 2/25 Birads-1 -DEXA 2/25 -Colonoscopy - 6/22 - scheduled  in June of this year for repeat after rectal prolapse surgery  Outpatient Encounter Medications as of 04/13/2023  Medication Sig   Calcium-Magnesium-Vitamin D (CALCIUM 1200+D3 PO) Take 1 tablet by mouth daily.   diclofenac (VOLTAREN) 50 MG EC tablet TAKE 1 TABLET BY MOUTH ONCE DAILY AS NEEDED FOR MODERATE PAIN   glucosamine-chondroitin 500-400 MG tablet Take 1 tablet by mouth in the morning and at bedtime.   LORazepam (ATIVAN) 0.5 MG tablet Take 0.5 mg by mouth daily as needed for anxiety.   losartan-hydrochlorothiazide (HYZAAR) 50-12.5 MG tablet Take 1 tablet by mouth daily.   meclizine (ANTIVERT) 25 MG tablet Take 1 tablet (25 mg total) by mouth 3 (three) times daily as needed for dizziness.   Multiple Vitamin (MULTIVITAMIN) tablet Take 1 tablet by mouth daily.   Omega-3 1000 MG CAPS Take 1,000 mg by mouth in the morning and at bedtime.   rosuvastatin (CRESTOR) 10 MG tablet Take 1 tablet (10 mg total) by mouth daily.   valACYclovir (VALTREX) 1000 MG tablet Take 0.5 tablets (500 mg total) by mouth daily.   venlafaxine XR (EFFEXOR-XR) 75 MG 24 hr capsule TAKE 1 CAPSULE BY MOUTH ONCE DAILY WITH BREAKFAST   No facility-administered encounter medications on file as of 04/13/2023.    Past Medical History:  Diagnosis Date   Anxiety    Cataract    Complex tear of medial meniscus of right knee as current injury 12/30/2011   COVID    had in Oct 2020, had h/a and runny nose for  1 week, lost taste and smell for several weeks. all s/s resolved   Dental crowns present    x 2   Family history of adverse reaction to anesthesia    daughter has PONV   Headache(784.0)    due to trigeminal neuralgia and trauma of surgery   Herpes simplex type 1 infection    Hyperlipidemia    Hypertension    dx'ed in last six months   Knee dislocation 12/2011   right   Localized osteoarthritis of right knee 12/30/2011   Osteoporosis    Primary localized osteoarthritis of right hip 02/13/2018   Trigeminal  neuralgia    Wears glasses    as stated on July 20, 2020    Past Surgical History:  Procedure Laterality Date   ABDOMINAL HYSTERECTOMY  1986   partial   COLONOSCOPY     cyst removed right hand     ganglion x 2   EVALUATION UNDER ANESTHESIA WITH HEMORRHOIDECTOMY N/A 07/21/2020   Procedure: EXAM UNDER ANESTHESIA WITH HEMORRHOIDECTOMY WITH LIGATION AND HEMORRHOIDOPEXY; REMOVAL OF PROLAPSING RECTAL TISSUE;  Surgeon: Karie Soda, MD;  Location: [facility] [place];  Service: General;  Laterality: N/A;   HEMORRHOID SURGERY  01/20/2021   Procedure: HEMORRHOIDECTOMY;  Surgeon: Karie Soda, MD;  Location: [facility];  Service: General;;   JOINT REPLACEMENT     KNEE ARTHROSCOPY WITH MEDIAL MENISECTOMY  12/30/2011   Procedure: KNEE ARTHROSCOPY WITH MEDIAL MENISECTOMY;  Surgeon: Eulas Post, MD;  Location: [facility];  Service: Orthopedics;  Laterality: Right;  RIGHT KNEE SCOPE Partial MEDIAL MENISCECTOMY   PARTIAL KNEE ARTHROPLASTY Right 01/01/2013   Procedure: UNICOMPARTMENTAL KNEE;  Surgeon: Eulas Post, MD;  Location: [facility];  Service: Orthopedics;  Laterality: Right;   PARTIAL PROCTECTOMY BY TEM N/A 01/20/2021   Procedure: PARTIAL PROCTECTOMY BY TEM;  Surgeon: Karie Soda, MD;  Location: [facility];  Service: General;  Laterality: N/A;   TOTAL HIP ARTHROPLASTY Right 02/13/2018   Procedure: TOTAL HIP ARTHROPLASTY;  Surgeon: Teryl Lucy, MD;  Location: [facility];  Service: Orthopedics;  Laterality: Right;   TRIGEMINAL NERVE DECOMPRESSION  04/2004   TUBAL LIGATION      Family History  Problem Relation Age of Onset   Arthritis Mother    Asthma Mother    Heart disease Mother    Diabetes Father    Stroke Father    Anesthesia problems Daughter        post-op N/V   Lymphoma Daughter        breast    Social History   Socioeconomic History   Marital status: Married    Spouse name: Not on file   Number of children: Not on file   Years of education: Not on file    Highest education level: 12th grade  Occupational History   Not on file  Tobacco Use   Smoking status: Never   Smokeless tobacco: Never  Vaping Use   Vaping status: Never Used  Substance and Sexual Activity   Alcohol use: No   Drug use: Never   Sexual activity: Yes    Birth control/protection: Surgical  Other Topics Concern   Not on file  Social History Narrative   Not on file   Social Drivers of Health   Financial Resource Strain: Low Risk  (04/09/2023)   Overall Financial Resource Strain (CARDIA)    Difficulty of Paying Living Expenses: Not hard at all  Food Insecurity: No Food Insecurity (04/09/2023)   Hunger Vital Sign  Worried About Programme researcher, broadcasting/film/video in the Last Year: Never true    Ran Out of Food in the Last Year: Never true  Transportation Needs: No Transportation Needs (04/09/2023)   PRAPARE - Administrator, Civil Service (Medical): No    Lack of Transportation (Non-Medical): No  Physical Activity: Sufficiently Active (04/09/2023)   Exercise Vital Sign    Days of Exercise per Week: 5 days    Minutes of Exercise per Session: 30 min  Stress: No Stress Concern Present (04/09/2023)   [employer] of Occupational Health - Occupational Stress Questionnaire    Feeling of Stress : Not at all  Social Connections: Socially Integrated (04/09/2023)   Social Connection and Isolation Panel [NHANES]    Frequency of Communication with Friends and Family: Three times a week    Frequency of Social Gatherings with Friends and Family: Once a week    Attends Religious Services: More than 4 times per year    Active Member of [employer] or Organizations: Yes    Attends Engineer, structural: More than 4 times per year    Marital Status: Married  Catering manager Violence: Not At Risk (08/11/2022)   Humiliation, Afraid, Rape, and Kick questionnaire    Fear of Current or Ex-Partner: No    Emotionally Abused: No    Physically Abused: No    Sexually Abused: No     Review of Systems  Constitutional:  Negative for chills and fever.  Respiratory:  Negative for shortness of breath.   Cardiovascular:  Negative for chest pain.  Musculoskeletal:  Positive for joint pain.  Neurological:  Negative for headaches.        Objective    BP 128/72 (Cuff Size: Large)   Pulse 92   Temp 97.8 F (36.6 C) (Oral)   Resp 16   Ht 5' 4.5" (1.638 m)   Wt 171 lb 9.6 oz (77.8 kg)   SpO2 98%   BMI 29.00 kg/m   Physical Exam Constitutional:      Appearance: Normal appearance.  HENT:     Head: Normocephalic and atraumatic.     Mouth/Throat:     Mouth: Mucous membranes are moist.     Pharynx: Oropharynx is clear.  Eyes:     Extraocular Movements: Extraocular movements intact.     Conjunctiva/sclera: Conjunctivae normal.     Pupils: Pupils are equal, round, and reactive to light.  Neck:     Comments: No thyromegaly  Cardiovascular:     Rate and Rhythm: Normal rate and regular rhythm.  Pulmonary:     Effort: Pulmonary effort is normal.     Breath sounds: Normal breath sounds.  Musculoskeletal:     Cervical back: No tenderness.     Right lower leg: No edema.     Left lower leg: No edema.  Lymphadenopathy:     Cervical: No cervical adenopathy.  Skin:    General: Skin is warm and dry.  Neurological:     General: No focal deficit present.     Mental Status: She is alert. Mental status is at baseline.  Psychiatric:        Mood and Affect: Mood normal.        Behavior: Behavior normal.       Assessment & Plan:   Essential (primary) hypertension Assessment & Plan: Blood pressure stable here today, no changes made to medications and appropriate refills sent to pharmacy. Labs due.   Orders: -  CBC with Differential/Platelet -     COMPLETE METABOLIC PANEL WITHOUT GFR -     Losartan Potassium-HCTZ; Take 1 tablet by mouth daily.  Dispense: 90 tablet; Refill: 1  Mixed hyperlipidemia Assessment & Plan: Recheck labs, continue  statin.  Orders: -     Lipid panel -     Rosuvastatin Calcium; Take 1 tablet (10 mg total) by mouth daily.  Dispense: 90 tablet; Refill: 1  HSV-1 infection Assessment & Plan: Stable, no recent flares. Refill Valtrex.   Orders: -     valACYclovir HCl; Take 0.5 tablets (500 mg total) by mouth daily.  Dispense: 90 tablet; Refill: 1  Arthritis Assessment & Plan: Osteoarthritis, has had multiple joint replacements and following with Ortho. Currently taking Diclofenac as needed but cannot tolerate other anti-inflammatories.    Vasomotor symptoms due to menopause Assessment & Plan: Doing well on Effexor 75 mg, continue.   Vitamin D deficiency Assessment & Plan: Reviewed DEXA scan from earlier this year, osteopenia in left hip. Will recheck Vitamin D levels, not currently on supplements.   Orders: -     VITAMIN D 25 Hydroxy (Vit-D Deficiency, Fractures)  Weight gain -     TSH    Return in about 6 months (around 10/13/2023).   Margarita Mail, DO

## 2023-04-14 ENCOUNTER — Encounter: Payer: Self-pay | Admitting: Internal Medicine

## 2023-04-26 DIAGNOSIS — M5431 Sciatica, right side: Secondary | ICD-10-CM | POA: Diagnosis not present

## 2023-05-03 DIAGNOSIS — L821 Other seborrheic keratosis: Secondary | ICD-10-CM | POA: Diagnosis not present

## 2023-05-03 DIAGNOSIS — Z85828 Personal history of other malignant neoplasm of skin: Secondary | ICD-10-CM | POA: Diagnosis not present

## 2023-05-03 DIAGNOSIS — D1801 Hemangioma of skin and subcutaneous tissue: Secondary | ICD-10-CM | POA: Diagnosis not present

## 2023-05-03 DIAGNOSIS — L820 Inflamed seborrheic keratosis: Secondary | ICD-10-CM | POA: Diagnosis not present

## 2023-05-03 DIAGNOSIS — L570 Actinic keratosis: Secondary | ICD-10-CM | POA: Diagnosis not present

## 2023-05-03 DIAGNOSIS — L578 Other skin changes due to chronic exposure to nonionizing radiation: Secondary | ICD-10-CM | POA: Diagnosis not present

## 2023-05-03 DIAGNOSIS — D692 Other nonthrombocytopenic purpura: Secondary | ICD-10-CM | POA: Diagnosis not present

## 2023-05-24 DIAGNOSIS — M5431 Sciatica, right side: Secondary | ICD-10-CM | POA: Diagnosis not present

## 2023-05-25 ENCOUNTER — Encounter: Payer: Self-pay | Admitting: Internal Medicine

## 2023-07-05 ENCOUNTER — Other Ambulatory Visit: Payer: Self-pay | Admitting: Internal Medicine

## 2023-07-05 DIAGNOSIS — M1990 Unspecified osteoarthritis, unspecified site: Secondary | ICD-10-CM

## 2023-07-05 DIAGNOSIS — N951 Menopausal and female climacteric states: Secondary | ICD-10-CM

## 2023-07-06 NOTE — Telephone Encounter (Signed)
 Requested Prescriptions  Pending Prescriptions Disp Refills   diclofenac  (VOLTAREN ) 50 MG EC tablet [Pharmacy Med Name: Diclofenac  Sodium 50 MG Oral Tablet Delayed Release] 90 tablet 1    Sig: TAKE 1 TABLET BY MOUTH ONCE DAILY AS NEEDED FOR MODERATE PAIN     Analgesics:  NSAIDS Failed - 07/06/2023  5:12 PM      Failed - Manual Review: Labs are only required if the patient has taken medication for more than 8 weeks.      Passed - Cr in normal range and within 360 days    Creat  Date Value Ref Range Status  04/13/2023 0.82 0.60 - 1.00 mg/dL Final         Passed - HGB in normal range and within 360 days    Hemoglobin  Date Value Ref Range Status  04/13/2023 13.4 11.7 - 15.5 g/dL Final         Passed - PLT in normal range and within 360 days    Platelets  Date Value Ref Range Status  04/13/2023 346 140 - 400 Thousand/uL Final         Passed - HCT in normal range and within 360 days    HCT  Date Value Ref Range Status  04/13/2023 41.7 35.0 - 45.0 % Final         Passed - eGFR is 30 or above and within 360 days    GFR calc Af Amer  Date Value Ref Range Status  02/14/2018 >60 >60 mL/min Final   GFR, Estimated  Date Value Ref Range Status  01/07/2021 >60 >60 mL/min Final    Comment:    (NOTE) Calculated using the CKD-EPI Creatinine Equation (2021)    eGFR  Date Value Ref Range Status  04/08/2022 69 > OR = 60 mL/min/1.52m2 Final   EGFR  Date Value Ref Range Status  10/05/2022 52.0  Final    Comment:    Abstracted by HIM         Passed - Patient is not pregnant      Passed - Valid encounter within last 12 months    Recent Outpatient Visits           2 months ago Essential (primary) hypertension   [facility] [facility] Bernardo Fend, DO               venlafaxine  XR (EFFEXOR -XR) 75 MG 24 hr capsule [Pharmacy Med Name: Venlafaxine  HCl ER 75 MG Oral Capsule Extended Release 24 Hour] 90 capsule 1    Sig: TAKE 1 CAPSULE BY MOUTH ONCE DAILY  WITH BREAKFAST     Psychiatry: Antidepressants - SNRI - desvenlafaxine & venlafaxine  Failed - 07/06/2023  5:12 PM      Failed - Lipid Panel in normal range within the last 12 months    Cholesterol  Date Value Ref Range Status  04/13/2023 142 <200 mg/dL Final   LDL Cholesterol (Calc)  Date Value Ref Range Status  04/13/2023 62 mg/dL (calc) Final    Comment:    Reference range: <100 . Desirable range <100 mg/dL for primary prevention;   <70 mg/dL for patients with CHD or diabetic patients  with > or = 2 CHD risk factors. SABRA LDL-C is now calculated using the Martin-Hopkins  calculation, which is a validated novel method providing  better accuracy than the Friedewald equation in the  estimation of LDL-C.  Gladis APPLETHWAITE et al. SANDREA. 7986;689(80): 2061-2068  (http://education.[facility].com/faq/FAQ164)    HDL  Date Value  Ref Range Status  04/13/2023 59 > OR = 50 mg/dL Final   Triglycerides  Date Value Ref Range Status  04/13/2023 120 <150 mg/dL Final         Passed - Cr in normal range and within 360 days    Creat  Date Value Ref Range Status  04/13/2023 0.82 0.60 - 1.00 mg/dL Final         Passed - Last BP in normal range    BP Readings from Last 1 Encounters:  04/13/23 128/72         Passed - Valid encounter within last 6 months    Recent Outpatient Visits           2 months ago Essential (primary) hypertension   [facility] Bernardo Fend, [place]

## 2023-09-11 ENCOUNTER — Other Ambulatory Visit: Payer: Self-pay | Admitting: Internal Medicine

## 2023-09-11 DIAGNOSIS — J40 Bronchitis, not specified as acute or chronic: Secondary | ICD-10-CM

## 2023-09-12 NOTE — Telephone Encounter (Signed)
 Requested medication (s) are due for refill today: routing for review  Requested medication (s) are on the active medication list: no  Last refill:  01/13/23  Future visit scheduled: yes  Notes to clinic:  Unable to refill per protocol, cannot delegate.      Requested Prescriptions  Pending Prescriptions Disp Refills   amoxicillin -clavulanate (AUGMENTIN ) 875-125 MG tablet [Pharmacy Med Name: Amoxicillin -Pot Clavulanate 875-125 MG Oral Tablet] 10 tablet 0    Sig: Take 1 tablet by mouth twice daily for 5 days     Off-Protocol Failed - 09/12/2023  3:23 PM      Failed - Medication not assigned to a protocol, review manually.      Passed - Valid encounter within last 12 months    Recent Outpatient Visits           5 months ago Essential (primary) hypertension   [facility] Bernardo Fend, [place]

## 2023-10-13 ENCOUNTER — Other Ambulatory Visit: Payer: Self-pay

## 2023-10-13 ENCOUNTER — Ambulatory Visit (INDEPENDENT_AMBULATORY_CARE_PROVIDER_SITE_OTHER): Admitting: Internal Medicine

## 2023-10-13 ENCOUNTER — Encounter: Payer: Self-pay | Admitting: Internal Medicine

## 2023-10-13 VITALS — BP 118/74 | HR 86 | Temp 98.0°F | Resp 16 | Ht 64.5 in | Wt 173.0 lb

## 2023-10-13 DIAGNOSIS — Z23 Encounter for immunization: Secondary | ICD-10-CM | POA: Diagnosis not present

## 2023-10-13 DIAGNOSIS — I10 Essential (primary) hypertension: Secondary | ICD-10-CM | POA: Diagnosis not present

## 2023-10-13 DIAGNOSIS — R0683 Snoring: Secondary | ICD-10-CM | POA: Diagnosis not present

## 2023-10-13 DIAGNOSIS — E782 Mixed hyperlipidemia: Secondary | ICD-10-CM

## 2023-10-13 DIAGNOSIS — B009 Herpesviral infection, unspecified: Secondary | ICD-10-CM

## 2023-10-13 DIAGNOSIS — N951 Menopausal and female climacteric states: Secondary | ICD-10-CM | POA: Diagnosis not present

## 2023-10-13 MED ORDER — ROSUVASTATIN CALCIUM 10 MG PO TABS: 10.0000 mg | ORAL_TABLET | Freq: Every day | ORAL | 1 refills | Status: AC

## 2023-10-13 MED ORDER — LOSARTAN POTASSIUM-HCTZ 50-12.5 MG PO TABS: 1.0000 | ORAL_TABLET | Freq: Every day | ORAL | 1 refills | Status: AC

## 2023-10-13 MED ORDER — VALACYCLOVIR HCL 1 G PO TABS: 500.0000 mg | ORAL_TABLET | Freq: Every day | ORAL | 1 refills | Status: AC

## 2023-10-13 MED ORDER — VENLAFAXINE HCL ER 37.5 MG PO CP24
37.5000 mg | ORAL_CAPSULE | Freq: Every day | ORAL | 1 refills | Status: AC
Start: 2023-10-13 — End: ?

## 2023-10-13 NOTE — Progress Notes (Signed)
 Established Patient Office Visit  Subjective    Patient ID: Laura Wright, female    DOB: 06-Feb-1950  Age: 73 y.o. MRN: [id number]  CC:  Chief Complaint  Patient presents with   Medical Management of Chronic Issues    6 month recheck    HPI [name] presents for follow up on chronic medical conditions.   Discussed the use of AI scribe software for clinical note transcription with the patient, who gave verbal consent to proceed.  History of Present Illness Laura Wright is a 73 year old female with arteriosclerosis who presents with concerns about sleep apnea and involuntary mouth movements.  She is here for a follow-up on her arteriosclerosis, with recent testing including abdominal aortic aneurysm screening and carotid artery evaluation. Her current medications include rosuvastatin , omega-3 supplements, losartan  hydrochlorothiazide , and Valtrex . She has stopped taking fish oil without noticing any changes.  She experiences significant snoring, as noted by her husband, raising concerns about sleep apnea. She does not wake up gasping for air but feels unrested upon waking and experiences daytime fatigue.  Involuntary mouth movements have been observed by her husband, sister-in-law, and daughter, particularly during concentration. She is not on medications known to cause tardive dyskinesia. She takes Effexor  (venlafaxine ) 70 mg for hot flashes, which she believes may affect her libido.  Hypertension: -Medications: Losartan -HCTZ 50-12.5 mg  -Patient is compliant with above medications and reports no side effects. -Checking BP at home (average): Does not check -Denies any SOB, CP, vision changes, LE edema or symptoms of hypotension  HLD: -Medications: Crestor  10 mg -Patient is compliant with above medications and reports no side effects.  -Last lipid panel:  Lipid Panel     Component Value Date/Time   CHOL 142 04/13/2023 1042   TRIG 120 04/13/2023 1042   HDL 59  04/13/2023 1042   CHOLHDL 2.4 04/13/2023 1042   LDLCALC 62 04/13/2023 1042    VMS/Hot Flashes: -Currently on Effexor  75 mg daily but having some decreased libido -History of hysterectomy in 1986 secondary to uterine fibroids, has not had any vaginal bleeding since that time  Hx of recurrent oral HSV: -Currently Valtrex  500 mg daily, has not had a flare in several years -Does occasionally get cold sores around her mouth, needs refills  Arthritis: -Has OA in multiple sites, had both knees replaced as well as hip and is planning on shoulder soon -Does have pain in hands and fingers -Had been taking Celebrex  but caused some abdominal symptoms, now taking Diclofenac  as needed, also has a prescription for voltaren  -Thinking about restarting back injections again  Health Maintenance: -Blood work UTD -Mammogram 2/25 Birads-1 -DEXA 2/25 -Colonoscopy 10/23  Outpatient Encounter Medications as of 10/13/2023  Medication Sig   Calcium -Magnesium -Vitamin D  (CALCIUM  1200+D3 PO) Take 1 tablet by mouth daily.   diclofenac  (VOLTAREN ) 50 MG EC tablet TAKE 1 TABLET BY MOUTH ONCE DAILY AS NEEDED FOR MODERATE PAIN   glucosamine-chondroitin 500-400 MG tablet Take 1 tablet by mouth in the morning and at bedtime.   LORazepam  (ATIVAN ) 0.5 MG tablet Take 0.5 mg by mouth daily as needed for anxiety.   losartan -hydrochlorothiazide  (HYZAAR) 50-12.5 MG tablet Take 1 tablet by mouth daily.   meclizine  (ANTIVERT ) 25 MG tablet Take 1 tablet (25 mg total) by mouth 3 (three) times daily as needed for dizziness.   Multiple Vitamin (MULTIVITAMIN) tablet Take 1 tablet by mouth daily.   Omega-3 1000 MG CAPS Take 1,000 mg by mouth in the morning and  at bedtime.   rosuvastatin  (CRESTOR ) 10 MG tablet Take 1 tablet (10 mg total) by mouth daily.   valACYclovir  (VALTREX ) 1000 MG tablet Take 0.5 tablets (500 mg total) by mouth daily.   venlafaxine  XR (EFFEXOR -XR) 75 MG 24 hr capsule TAKE 1 CAPSULE BY MOUTH ONCE DAILY WITH  BREAKFAST   No facility-administered encounter medications on file as of 10/13/2023.    Past Medical History:  Diagnosis Date   Anxiety    Cataract    Complex tear of medial meniscus of right knee as current injury 12/30/2011   COVID    had in Oct 2020, had h/a and runny nose for 1 week, lost taste and smell for several weeks. all s/s resolved   Dental crowns present    x 2   Family history of adverse reaction to anesthesia    daughter has PONV   Headache(784.0)    due to trigeminal neuralgia and trauma of surgery   Herpes simplex type 1 infection    Hyperlipidemia    Hypertension    dx'ed in last six months   Knee dislocation 12/2011   right   Localized osteoarthritis of right knee 12/30/2011   Osteoporosis    Primary localized osteoarthritis of right hip 02/13/2018   Trigeminal neuralgia    Wears glasses    as stated on July 20, 2020    Past Surgical History:  Procedure Laterality Date   ABDOMINAL HYSTERECTOMY  1986   partial   COLONOSCOPY     cyst removed right hand     ganglion x 2   EVALUATION UNDER ANESTHESIA WITH HEMORRHOIDECTOMY N/A 07/21/2020   Procedure: EXAM UNDER ANESTHESIA WITH HEMORRHOIDECTOMY WITH LIGATION AND HEMORRHOIDOPEXY; REMOVAL OF PROLAPSING RECTAL TISSUE;  Surgeon: Sheldon Standing, MD;  Location: [facility];  Service: General;  Laterality: N/A;   HEMORRHOID SURGERY  01/20/2021   Procedure: HEMORRHOIDECTOMY;  Surgeon: Sheldon Standing, MD;  Location: [facility];  Service: General;;   JOINT REPLACEMENT     KNEE ARTHROSCOPY WITH MEDIAL MENISECTOMY  12/30/2011   Procedure: KNEE ARTHROSCOPY WITH MEDIAL MENISECTOMY;  Surgeon: Fonda SHAUNNA Olmsted, MD;  Location: [facility];  Service: Orthopedics;  Laterality: Right;  RIGHT KNEE SCOPE Partial MEDIAL MENISCECTOMY   PARTIAL KNEE ARTHROPLASTY Right 01/01/2013   Procedure: UNICOMPARTMENTAL KNEE;  Surgeon: Fonda SHAUNNA Olmsted, MD;  Location: [facility];  Service: Orthopedics;  Laterality: Right;    PARTIAL PROCTECTOMY BY TEM N/A 01/20/2021   Procedure: PARTIAL PROCTECTOMY BY TEM;  Surgeon: Sheldon Standing, MD;  Location: [facility];  Service: General;  Laterality: N/A;   TOTAL HIP ARTHROPLASTY Right 02/13/2018   Procedure: TOTAL HIP ARTHROPLASTY;  Surgeon: Olmsted Fonda, MD;  Location: [facility];  Service: Orthopedics;  Laterality: Right;   TRIGEMINAL NERVE DECOMPRESSION  04/2004   TUBAL LIGATION      Family History  Problem Relation Age of Onset   Arthritis Mother    Asthma Mother    Heart disease Mother    Diabetes Father    Stroke Father    Anesthesia problems Daughter        post-op N/V   Lymphoma Daughter        breast    Social History   Socioeconomic History   Marital status: Married    Spouse name: Not on file   Number of children: Not on file   Years of education: Not on file   Highest education level: 12th grade  Occupational History   Not on file  Tobacco Use   Smoking status: Never   Smokeless tobacco: Never  Vaping Use   Vaping status: Never Used  Substance and Sexual Activity   Alcohol use: No   Drug use: Never   Sexual activity: Yes    Birth control/protection: Surgical  Other Topics Concern   Not on file  Social History Narrative   Not on file   Social Drivers of Health   Financial Resource Strain: Low Risk  (10/12/2023)   Overall Financial Resource Strain (CARDIA)    Difficulty of Paying Living Expenses: Not hard at all  Food Insecurity: No Food Insecurity (10/12/2023)   Hunger Vital Sign    Worried About Running Out of Food in the Last Year: Never true    Ran Out of Food in the Last Year: Never true  Transportation Needs: No Transportation Needs (10/12/2023)   PRAPARE - Administrator, Civil Service (Medical): No    Lack of Transportation (Non-Medical): No  Physical Activity: Insufficiently Active (10/12/2023)   Exercise Vital Sign    Days of Exercise per Week: 2 days    Minutes of Exercise per Session: 30 min  Stress: No Stress  Concern Present (10/12/2023)   [employer] of Occupational Health - Occupational Stress Questionnaire    Feeling of Stress: Not at all  Social Connections: Socially Integrated (10/12/2023)   Social Connection and Isolation Panel    Frequency of Communication with Friends and Family: Twice a week    Frequency of Social Gatherings with Friends and Family: Once a week    Attends Religious Services: More than 4 times per year    Active Member of [employer] or Organizations: Yes    Attends Engineer, structural: More than 4 times per year    Marital Status: Married  Catering manager Violence: Not At Risk (08/11/2022)   Humiliation, Afraid, Rape, and Kick questionnaire    Fear of Current or Ex-Partner: No    Emotionally Abused: No    Physically Abused: No    Sexually Abused: No    Review of Systems  Constitutional:  Negative for chills and fever.  Respiratory:  Negative for shortness of breath.   Cardiovascular:  Negative for chest pain.  Musculoskeletal:  Negative for joint pain.  Neurological:  Negative for headaches.        Objective    BP 118/74 (Cuff Size: Large)   Pulse 86   Temp 98 F (36.7 C) (Oral)   Resp 16   Ht 5' 4.5 (1.638 m)   Wt 173 lb (78.5 kg)   SpO2 99%   BMI 29.24 kg/m   Physical Exam Constitutional:      Appearance: Normal appearance.  HENT:     Head: Normocephalic and atraumatic.     Mouth/Throat:     Mouth: Mucous membranes are moist.     Pharynx: Oropharynx is clear.  Eyes:     Extraocular Movements: Extraocular movements intact.     Conjunctiva/sclera: Conjunctivae normal.     Pupils: Pupils are equal, round, and reactive to light.  Neck:     Comments: No thyromegaly  Cardiovascular:     Rate and Rhythm: Normal rate and regular rhythm.  Pulmonary:     Effort: Pulmonary effort is normal.     Breath sounds: Normal breath sounds.  Musculoskeletal:     Cervical back: No tenderness.     Right lower leg: No edema.     Left lower leg:  No edema.  Lymphadenopathy:  Cervical: No cervical adenopathy.  Skin:    General: Skin is warm and dry.  Neurological:     General: No focal deficit present.     Mental Status: She is alert. Mental status is at baseline.  Psychiatric:        Mood and Affect: Mood normal.        Behavior: Behavior normal.       Assessment & Plan:   Assessment & Plan Possible obstructive sleep apnea Reports of snoring and non-restorative sleep suggest possible obstructive sleep apnea. Experiences daytime fatigue and occasional morning headaches. - Refer to sleep medicine for evaluation and potential sleep study.  Decreased libido, likely medication-related Decreased libido likely related to venlafaxine  use. Discussed balance of managing hot flashes and potential side effects on libido. - Reduce venlafaxine  dose to 37.5 mg and monitor effects on libido and mood. - Monitor for withdrawal symptoms if discontinuing venlafaxine .  Vasomotor symptoms of menopause (hot flashes) Vasomotor symptoms managed with venlafaxine . Considering dose reduction to assess if symptoms persist and to evaluate impact on libido. - Reduce venlafaxine  dose to 37.5 mg and monitor for recurrence of hot flashes.  Involuntary mouth movements Reports of involuntary mouth movements noted by family members. No medication use typically causing tardive dyskinesia. Movements may be related to concentration or other benign causes. - Reassess if symptoms persist or worsen.  Carotid artery atherosclerosis, mild, unilateral Mild unilateral carotid artery atherosclerosis detected on recent screening. Managed with rosuvastatin . - Continue rosuvastatin .  Hyperlipidemia Hyperlipidemia managed with rosuvastatin . Recent lipid panel was good. - Continue rosuvastatin . - Recheck lipid panel in the spring.  Hypertension Hypertension well controlled with current medication regimen. Recent blood pressure reading was 118/74 mmHg. - Continue  current antihypertensive regimen.  - losartan -hydrochlorothiazide  (HYZAAR) 50-12.5 MG tablet; Take 1 tablet by mouth daily.  Dispense: 90 tablet; Refill: 1 - rosuvastatin  (CRESTOR ) 10 MG tablet; Take 1 tablet (10 mg total) by mouth daily.  Dispense: 90 tablet; Refill: 1 - venlafaxine  XR (EFFEXOR  XR) 37.5 MG 24 hr capsule; Take 1 capsule (37.5 mg total) by mouth daily with breakfast.  Dispense: 90 capsule; Refill: 1 - valACYclovir  (VALTREX ) 1000 MG tablet; Take 0.5 tablets (500 mg total) by mouth daily.  Dispense: 90 tablet; Refill: 1 - Ambulatory referral to Pulmonology - Flu vaccine HIGH DOSE PF(Fluzone Trivalent)  Return in about 6 months (around 04/12/2024).   Sharyle Fischer, DO

## 2023-10-25 ENCOUNTER — Ambulatory Visit: Admitting: Sleep Medicine

## 2023-10-26 ENCOUNTER — Ambulatory Visit: Payer: Medicare Other

## 2023-10-26 DIAGNOSIS — Z0000 Encounter for general adult medical examination without abnormal findings: Secondary | ICD-10-CM

## 2023-10-26 NOTE — Progress Notes (Signed)
 Subjective:   Laura Wright is a 73 y.o. who presents for a Medicare Wellness preventive visit.  As a reminder, Annual Wellness Visits don't include a physical exam, and some assessments may be limited, especially if this visit is performed virtually. We may recommend an in-person follow-up visit with your provider if needed.  Visit Complete: Virtual I connected with  Laura Wright on 10/26/23 by a audio enabled telemedicine application and verified that I am speaking with the correct person using two identifiers.  Patient Location: Home  Provider Location: Home Office  I discussed the limitations of evaluation and management by telemedicine. The patient expressed understanding and agreed to proceed.  Vital Signs: Because this visit was a virtual/telehealth visit, some criteria may be missing or patient reported. Any vitals not documented were not able to be obtained and vitals that have been documented are patient reported.  VideoDeclined- This patient declined Librarian, academic. Therefore the visit was completed with audio only.  Persons Participating in Visit: Patient.  AWV Questionnaire: No: Patient Medicare AWV questionnaire was not completed prior to this visit.  Cardiac Risk Factors include: advanced age (>65men, >21 women);dyslipidemia;hypertension     Objective:    There were no vitals filed for this visit. There is no height or weight on file to calculate BMI.     10/26/2023    3:27 PM 08/11/2022   11:00 AM 01/20/2021   10:41 AM 01/07/2021   10:28 AM 07/21/2020   12:17 PM 02/13/2018    6:21 AM 02/06/2018    8:23 AM  Advanced Directives  Does Patient Have a Medical Advance Directive? No No No No No No  No   Would patient like information on creating a medical advance directive? No - Patient declined  No - Patient declined No - Patient declined No - Patient declined No - Patient declined  No - Patient declined      Data saved with a  previous flowsheet row definition    Current Medications (verified) Outpatient Encounter Medications as of 10/26/2023  Medication Sig   Calcium -Magnesium -Vitamin D  (CALCIUM  1200+D3 PO) Take 1 tablet by mouth daily.   diclofenac  (VOLTAREN ) 50 MG EC tablet TAKE 1 TABLET BY MOUTH ONCE DAILY AS NEEDED FOR MODERATE PAIN   LORazepam  (ATIVAN ) 0.5 MG tablet Take 0.5 mg by mouth daily as needed for anxiety.   losartan -hydrochlorothiazide  (HYZAAR) 50-12.5 MG tablet Take 1 tablet by mouth daily.   meclizine  (ANTIVERT ) 25 MG tablet Take 1 tablet (25 mg total) by mouth 3 (three) times daily as needed for dizziness.   Multiple Vitamin (MULTIVITAMIN) tablet Take 1 tablet by mouth daily.   rosuvastatin  (CRESTOR ) 10 MG tablet Take 1 tablet (10 mg total) by mouth daily.   Turmeric 500 MG CAPS Take by mouth daily.   valACYclovir  (VALTREX ) 1000 MG tablet Take 0.5 tablets (500 mg total) by mouth daily.   venlafaxine  XR (EFFEXOR  XR) 37.5 MG 24 hr capsule Take 1 capsule (37.5 mg total) by mouth daily with breakfast.   glucosamine-chondroitin 500-400 MG tablet Take 1 tablet by mouth in the morning and at bedtime. (Patient not taking: Reported on 10/26/2023)   Omega-3 1000 MG CAPS Take 1,000 mg by mouth in the morning and at bedtime. (Patient not taking: Reported on 10/26/2023)   No facility-administered encounter medications on file as of 10/26/2023.    Allergies (verified) Macrodantin [nitrofurantoin macrocrystal]   History: Past Medical History:  Diagnosis Date   Anxiety    Cataract  Complex tear of medial meniscus of right knee as current injury 12/30/2011   COVID    had in Oct 2020, had h/a and runny nose for 1 week, lost taste and smell for several weeks. all s/s resolved   Dental crowns present    x 2   Family history of adverse reaction to anesthesia    daughter has PONV   Headache(784.0)    due to trigeminal neuralgia and trauma of surgery   Herpes simplex type 1 infection    Hyperlipidemia     Hypertension    dx'ed in last six months   Knee dislocation 12/2011   right   Localized osteoarthritis of right knee 12/30/2011   Osteoporosis    Primary localized osteoarthritis of right hip 02/13/2018   Trigeminal neuralgia    Wears glasses    as stated on July 20, 2020   Past Surgical History:  Procedure Laterality Date   ABDOMINAL HYSTERECTOMY  1986   partial   COLONOSCOPY     cyst removed right hand     ganglion x 2   EVALUATION UNDER ANESTHESIA WITH HEMORRHOIDECTOMY N/A 07/21/2020   Procedure: EXAM UNDER ANESTHESIA WITH HEMORRHOIDECTOMY WITH LIGATION AND HEMORRHOIDOPEXY; REMOVAL OF PROLAPSING RECTAL TISSUE;  Surgeon: Sheldon Standing, MD;  Location: [facility];  Service: General;  Laterality: N/A;   HEMORRHOID SURGERY  01/20/2021   Procedure: HEMORRHOIDECTOMY;  Surgeon: Sheldon Standing, MD;  Location: [facility];  Service: General;;   JOINT REPLACEMENT     KNEE ARTHROSCOPY WITH MEDIAL MENISECTOMY  12/30/2011   Procedure: KNEE ARTHROSCOPY WITH MEDIAL MENISECTOMY;  Surgeon: Fonda SHAUNNA Olmsted, MD;  Location: [facility];  Service: Orthopedics;  Laterality: Right;  RIGHT KNEE SCOPE Partial MEDIAL MENISCECTOMY   PARTIAL KNEE ARTHROPLASTY Right 01/01/2013   Procedure: UNICOMPARTMENTAL KNEE;  Surgeon: Fonda SHAUNNA Olmsted, MD;  Location: [facility];  Service: Orthopedics;  Laterality: Right;   PARTIAL PROCTECTOMY BY TEM N/A 01/20/2021   Procedure: PARTIAL PROCTECTOMY BY TEM;  Surgeon: Sheldon Standing, MD;  Location: [facility];  Service: General;  Laterality: N/A;   TOTAL HIP ARTHROPLASTY Right 02/13/2018   Procedure: TOTAL HIP ARTHROPLASTY;  Surgeon: Olmsted Fonda, MD;  Location: [facility];  Service: Orthopedics;  Laterality: Right;   TRIGEMINAL NERVE DECOMPRESSION  04/2004   TUBAL LIGATION     Family History  Problem Relation Age of Onset   Arthritis Mother    Asthma Mother    Heart disease Mother    Diabetes Father    Stroke Father    Anesthesia problems Daughter         post-op N/V   Lymphoma Daughter        breast   Social History   Socioeconomic History   Marital status: Married    Spouse name: Not on file   Number of children: Not on file   Years of education: Not on file   Highest education level: 12th grade  Occupational History   Not on file  Tobacco Use   Smoking status: Never   Smokeless tobacco: Never  Vaping Use   Vaping status: Never Used  Substance and Sexual Activity   Alcohol use: No   Drug use: Never   Sexual activity: Yes    Birth control/protection: Surgical  Other Topics Concern   Not on file  Social History Narrative   Not on file   Social Drivers of Health   Financial Resource Strain: Low Risk  (10/26/2023)   Overall Physicist, medical Strain (  CARDIA)    Difficulty of Paying Living Expenses: Not hard at all  Food Insecurity: No Food Insecurity (10/26/2023)   Hunger Vital Sign    Worried About Running Out of Food in the Last Year: Never true    Ran Out of Food in the Last Year: Never true  Transportation Needs: No Transportation Needs (10/26/2023)   PRAPARE - Administrator, Civil Service (Medical): No    Lack of Transportation (Non-Medical): No  Physical Activity: Sufficiently Active (10/26/2023)   Exercise Vital Sign    Days of Exercise per Week: 5 days    Minutes of Exercise per Session: 30 min  Recent Concern: Physical Activity - Insufficiently Active (10/22/2023)   Exercise Vital Sign    Days of Exercise per Week: 4 days    Minutes of Exercise per Session: 30 min  Stress: No Stress Concern Present (10/26/2023)   [employer] of Occupational Health - Occupational Stress Questionnaire    Feeling of Stress: Not at all  Social Connections: Socially Integrated (10/26/2023)   Social Connection and Isolation Panel    Frequency of Communication with Friends and Family: More than three times a week    Frequency of Social Gatherings with Friends and Family: Twice a week    Attends Religious  Services: More than 4 times per year    Active Member of [employer] or Organizations: Yes    Attends Engineer, structural: More than 4 times per year    Marital Status: Married    Tobacco Counseling Counseling given: Not Answered    Clinical Intake:  Pre-visit preparation completed: Yes  Pain : No/denies pain     BMI - recorded: 29.2 Nutritional Status: BMI 25 -29 Overweight Nutritional Risks: None Diabetes: No  Lab Results  Component Value Date   HGBA1C 5.8 (H) 01/07/2021     How often do you need to have someone help you when you read instructions, pamphlets, or other written materials from your doctor or pharmacy?: 1 - Never  Interpreter Needed?: No  Information entered by :: JHONNIE DAS, LPN   Activities of Daily Living    10/26/2023    3:28 PM 10/22/2023    4:16 PM  In your present state of health, do you have any difficulty performing the following activities:  Hearing? 0 0  Vision? 0 0  Difficulty concentrating or making decisions? 0 0  Walking or climbing stairs? 0 0  Dressing or bathing? 0 0  Doing errands, shopping? 0 0  Preparing Food and eating ? N N  Using the Toilet? N N  In the past six months, have you accidently leaked urine? N N  Do you have problems with loss of bowel control? N N  Managing your Medications? N N  Managing your Finances? N N  Housekeeping or managing your Housekeeping? N N    Patient Care Team: Bernardo Fend, DO as PCP - General (Internal Medicine) Sheldon Standing, MD as Consulting Physician (General Surgery) Josefina Chew, MD as Consulting Physician (Orthopedic Surgery) [facility], [facility] (Optometry)  I have updated your Care Teams any recent Medical Services you may have received from other providers in the past year.     Assessment:   This is a routine wellness examination for Laura Wright.  Hearing/Vision screen Hearing Screening - Comments:: NO AIDS Vision Screening - Comments:: WEARS GLASSES ALL  DAY- [facility]- SEEN ONCE PER YEAR   Goals Addressed  This Visit's Progress    DIET - EAT MORE FRUITS AND VEGETABLES         Depression Screen     10/26/2023    3:26 PM 10/13/2023    9:41 AM 04/13/2023   10:03 AM 11/09/2022    9:36 AM 10/17/2022    3:09 PM 10/13/2022   10:57 AM 08/11/2022   10:58 AM  PHQ 2/9 Scores  PHQ - 2 Score 0 0 0 0 0 0 0  PHQ- 9 Score 0  0 0 0 0     Fall Risk     10/26/2023    3:28 PM 10/22/2023    4:16 PM 10/13/2023    9:41 AM 04/13/2023   10:02 AM 11/09/2022    9:36 AM  Fall Risk   Falls in the past year? 1 1 0 0 0  Number falls in past yr: 0 0 0 0 0  Injury with Fall? 0 0 0 0 0  Risk for fall due to :   No Fall Risks No Fall Risks No Fall Risks  Follow up Falls evaluation completed;Falls prevention discussed  Falls evaluation completed Falls evaluation completed Falls prevention discussed    MEDICARE RISK AT HOME:  Medicare Risk at Home Any stairs in or around the home?: Yes If so, are there any without handrails?: No Home free of loose throw rugs in walkways, pet beds, electrical cords, etc?: Yes Adequate lighting in your home to reduce risk of falls?: Yes Life alert?: No Use of a cane, walker or w/c?: No Grab bars in the bathroom?: No Shower chair or bench in shower?: Yes Elevated toilet seat or a handicapped toilet?: Yes  TIMED UP AND GO:  Was the test performed?  No  Cognitive Function: 6CIT completed        10/26/2023    3:29 PM 08/11/2022   11:01 AM  6CIT Screen  What Year? 0 points 0 points  What month? 0 points 0 points  What time? 0 points 0 points  Count back from 20 0 points 0 points  Months in reverse 0 points 0 points  Repeat phrase 0 points 0 points  Total Score 0 points 0 points    Immunizations Immunization History  Administered Date(s) Administered   Fluad Quad(high Dose 65+) 10/07/2021   Fluad Trivalent(High Dose 65+) 10/13/2022   INFLUENZA, HIGH DOSE SEASONAL PF 10/05/2017, 12/03/2018,  10/16/2020, 10/13/2023   Influenza-Unspecified 10/20/2015, 10/25/2016, 10/16/2020   PNEUMOCOCCAL CONJUGATE-20 04/08/2022   Pneumococcal Conjugate-13 11/16/2017   Pneumococcal Polysaccharide-23 02/01/2019   Tdap 04/28/2014    Screening Tests Health Maintenance  Topic Date Due   COVID-19 Vaccine (1) Never done   Zoster Vaccines- Shingrix (1 of 2) Never done   Mammogram  02/08/2024   DTaP/Tdap/Td (2 - Td or Tdap) 04/27/2024   Medicare Annual Wellness (AWV)  10/25/2024   Colonoscopy  07/20/2025   DEXA SCAN  02/07/2026   Pneumococcal Vaccine: 50+ Years  Completed   Influenza Vaccine  Completed   Hepatitis C Screening  Completed   Meningococcal B Vaccine  Aged Out    Health Maintenance Items Addressed: WANTS NO MORE COVIDS; UTD ON OTHER SHOTS;UTD ON COLONOSCOPY & MAMMOGRAM & BDS   Additional Screening:  Vision Screening: Recommended annual ophthalmology exams for early detection of glaucoma and other disorders of the eye. Is the patient up to date with their annual eye exam?  Yes  Who is the provider or what is the name of the office in which the  patient attends annual eye exams? [facility]  Dental Screening: Recommended annual dental exams for proper oral hygiene  Community Resource Referral / Chronic Care Management: CRR required this visit?  No   CCM required this visit?  No   Plan:    I have personally reviewed and noted the following in the patient's chart:   Medical and social history Use of alcohol, tobacco or illicit drugs  Current medications and supplements including opioid prescriptions. Patient is not currently taking opioid prescriptions. Functional ability and status Nutritional status Physical activity Advanced directives List of other physicians Hospitalizations, surgeries, and ER visits in previous 12 months Vitals Screenings to include cognitive, depression, and falls Referrals and appointments  In addition, I have reviewed and discussed with  patient certain preventive protocols, quality metrics, and best practice recommendations. A written personalized care plan for preventive services as well as general preventive health recommendations were provided to patient.   Jhonnie GORMAN Das, LPN   [id number]   After Visit Summary: (MyChart) Due to this being a telephonic visit, the after visit summary with patients personalized plan was offered to patient via MyChart   Notes: Nothing significant to report at this time.

## 2023-10-26 NOTE — Patient Instructions (Addendum)
 Laura Wright,  Thank you for taking the time for your Medicare Wellness Visit. I appreciate your continued commitment to your health goals. Please review the care plan we discussed, and feel free to reach out if I can assist you further.  Medicare recommends these wellness visits once per year to help you and your care team stay ahead of potential health issues. These visits are designed to focus on prevention, allowing your provider to concentrate on managing your acute and chronic conditions during your regular appointments.  Please note that Annual Wellness Visits do not include a physical exam. Some assessments may be limited, especially if the visit was conducted virtually. If needed, we may recommend a separate in-person follow-up with your provider.  Ongoing Care Seeing your primary care provider every 3 to 6 months helps us  monitor your health and provide consistent, personalized care.   Referrals If a referral was made during today's visit and you haven't received any updates within two weeks, please contact the referred provider directly to check on the status.  Recommended Screenings:  Health Maintenance  Topic Date Due   COVID-19 Vaccine (1) Never done   Zoster (Shingles) Vaccine (1 of 2) Never done   Breast Cancer Screening  02/08/2024   DTaP/Tdap/Td vaccine (2 - Td or Tdap) 04/27/2024   Medicare Annual Wellness Visit  10/25/2024   Colon Cancer Screening  07/20/2025   DEXA scan (bone density measurement)  02/07/2026   Pneumococcal Vaccine for age over 52  Completed   Flu Shot  Completed   Hepatitis C Screening  Completed   Meningitis B Vaccine  Aged Out     Advance Care Planning is important because it: Ensures you receive medical care that aligns with your values, goals, and preferences. Provides guidance to your family and loved ones, reducing the emotional burden of decision-making during critical moments.  Vision: Annual vision screenings are recommended for early  detection of glaucoma, cataracts, and diabetic retinopathy. These exams can also reveal signs of chronic conditions such as diabetes and high blood pressure.  Dental: Annual dental screenings help detect early signs of oral cancer, gum disease, and other conditions linked to overall health, including heart disease and diabetes.  Please see the attached documents for additional preventive care recommendations.   NEXT AWV 10/31/24 @ 3:20 PM BY VIDEO

## 2023-10-31 ENCOUNTER — Ambulatory Visit: Admitting: Sleep Medicine

## 2023-10-31 ENCOUNTER — Encounter: Payer: Self-pay | Admitting: Sleep Medicine

## 2023-10-31 VITALS — BP 110/70 | HR 85 | Temp 98.2°F | Ht 64.5 in | Wt 173.6 lb

## 2023-10-31 DIAGNOSIS — G4733 Obstructive sleep apnea (adult) (pediatric): Secondary | ICD-10-CM | POA: Diagnosis not present

## 2023-10-31 DIAGNOSIS — I10 Essential (primary) hypertension: Secondary | ICD-10-CM

## 2023-10-31 NOTE — Progress Notes (Signed)
 Name:Laura Wright MRN: [id number] DOB: 01/25/1950   CHIEF COMPLAINT:  SNORING   HISTORY OF PRESENT ILLNESS: Laura Wright is a 73 y.o. w/ a h/o HTN, OA and hyperlipidemia who presents for c/o loud snoring which has been present for several years. Reports nocturnal awakenings due to nocturia, however does not have difficulty falling back to sleep. Denies any significant weight changes. Admits to dry mouth. Denies morning headaches, RLS symptoms, dream enactment, cataplexy, hypnagogic or hypnapompic hallucinations. Reports a family history of sleep apnea. Denies drowsy driving. Drinks 2 cups of coffee and 1 glass of tea daily, occasional alcohol use, denies tobacco or illicit drug use.   Bedtime 11 pm Sleep onset 10 mins Rise time 7-7:30 am   EPWORTH SLEEP SCORE 3    10/31/2023   11:00 AM  Results of the Epworth flowsheet  Sitting and reading 1  Watching TV 1  Sitting, inactive in a public place (e.g. a theatre or a meeting) 0  As a passenger in a car for an hour without a break 1  Lying down to rest in the afternoon when circumstances permit 0  Sitting and talking to someone 0  Sitting quietly after a lunch without alcohol 0  In a car, while stopped for a few minutes in traffic 0  Total score 3      PAST MEDICAL HISTORY :   has a past medical history of Anxiety, Cataract, Complex tear of medial meniscus of right knee as current injury (12/30/2011), COVID, Dental crowns present, Family history of adverse reaction to anesthesia, Headache(784.0), Herpes simplex type 1 infection, Hyperlipidemia, Hypertension, Knee dislocation (12/2011), Localized osteoarthritis of right knee (12/30/2011), Osteoporosis, Primary localized osteoarthritis of right hip (02/13/2018), Trigeminal neuralgia, and Wears glasses.  has a past surgical history that includes Abdominal hysterectomy (1986); Trigeminal nerve decompression (04/2004); Knee arthroscopy with medial menisectomy (12/30/2011);  Colonoscopy; Tubal ligation (1977); Partial knee arthroplasty (Right, 01/01/2013); cyst removed right hand; Total hip arthroplasty (Right, 02/13/2018); Exam under anesthesia with hemorrhoidectomy (N/A, 07/21/2020); Partial proctectomy by tem (N/A, 01/20/2021); Hemorrhoid surgery (01/20/2021); Joint replacement (2013); and Brain surgery (2006). Prior to Admission medications   Medication Sig Start Date End Date Taking? Authorizing Provider  Calcium -Magnesium -Vitamin D  (CALCIUM  1200+D3 PO) Take 1 tablet by mouth daily.   Yes [provider]  diclofenac  (VOLTAREN ) 50 MG EC tablet TAKE 1 TABLET BY MOUTH ONCE DAILY AS NEEDED FOR MODERATE PAIN 07/06/23  Yes Bernardo Fend, DO  Estradiol  (ELESTRIN ) 0.52 MG/0.87 GM (0.06%) GEL Apply 1 Application topically. 04/15/20  Yes [provider]  LORazepam  (ATIVAN ) 0.5 MG tablet Take 0.5 mg by mouth daily as needed for anxiety.   Yes [provider]  losartan -hydrochlorothiazide  (HYZAAR) 50-12.5 MG tablet Take 1 tablet by mouth daily. 10/13/23  Yes Bernardo Fend, DO  meclizine  (ANTIVERT ) 25 MG tablet Take 1 tablet (25 mg total) by mouth 3 (three) times daily as needed for dizziness. 11/09/15  Yes Gordan Huxley, MD  Menthol -Zinc Oxide 0.44-20.625 % OINT Apply 1 Application topically. 07/14/20  Yes [provider]  Multiple Vitamin (MULTIVITAMIN) tablet Take 1 tablet by mouth daily.   Yes [provider]  rosuvastatin  (CRESTOR ) 10 MG tablet Take 1 tablet (10 mg total) by mouth daily. 10/13/23  Yes Bernardo Fend, DO  Turmeric 500 MG CAPS Take by mouth daily.   Yes [provider]  valACYclovir  (VALTREX ) 1000 MG tablet Take 0.5 tablets (500 mg total) by mouth daily. 10/13/23  Yes Bernardo Fend, DO  venlafaxine  XR (EFFEXOR  XR) 37.5 MG 24 hr capsule Take 1 capsule (37.5 mg total) by mouth daily with breakfast. 10/13/23  Yes Bernardo Fend, DO  Omega-3 1000 MG CAPS Take 1,000 mg by mouth in the morning  and at bedtime. Patient not taking: Reported on 10/31/2023    [provider]   Allergies  Allergen Reactions   Macrodantin [Nitrofurantoin Macrocrystal] Itching and Rash    FAMILY HISTORY:  family history includes Anesthesia problems in her daughter; Arthritis in her mother; Asthma in her mother; Cancer in her mother; Diabetes in her father; Heart disease in her mother; Hypertension in her mother; Lymphoma in her daughter; Stroke in her father. SOCIAL HISTORY:  reports that she has never smoked. She has never used smokeless tobacco. She reports that she does not drink alcohol and does not use drugs.   Review of Systems:  Gen:  Denies  fever, sweats, chills weight loss  HEENT: Denies blurred vision, double vision, ear pain, eye pain, hearing loss, nose bleeds, sore throat Cardiac:  No dizziness, chest pain or heaviness, chest tightness,edema, No JVD Resp:   No cough, -sputum production, -shortness of breath,-wheezing, -hemoptysis,  Gi: Denies swallowing difficulty, stomach pain, nausea or vomiting, diarrhea, constipation, bowel incontinence Gu:  Denies bladder incontinence, burning urine Ext:   Denies Joint pain, stiffness or swelling Skin: Denies  skin rash, easy bruising or bleeding or hives Endoc:  Denies polyuria, polydipsia , polyphagia or weight change Psych:   Denies depression, insomnia or hallucinations  Other:  All other systems negative  VITAL SIGNS: BP 110/70   Pulse 85   Temp 98.2 F (36.8 C)   Ht 5' 4.5 (1.638 m)   Wt 173 lb 9.6 oz (78.7 kg)   SpO2 95%   BMI 29.34 kg/m    Physical Examination:   General Appearance: No distress  EYES PERRLA, EOM intact.   NECK Supple, No JVD Pulmonary: normal breath sounds, No wheezing.  CardiovascularNormal S1,S2.  No m/r/g.   Abdomen: Benign, Soft, non-tender. Skin:   warm, no rashes, no ecchymosis  Extremities: normal, no cyanosis, clubbing. Neuro:without focal findings,  speech normal  PSYCHIATRIC: Mood,  affect within normal limits.   ASSESSMENT AND PLAN  OSA I suspect that OSA is likely present due to clinical presentation. Discussed the consequences of untreated sleep apnea. Advised not to drive drowsy for safety of patient and others. Will complete further evaluation with a home sleep study and follow up to review results.    HTN Stable, on current management. Following with PCP.    MEDICATION ADJUSTMENTS/LABS AND TESTS ORDERED: Recommend Sleep Study   Patient  satisfied with Plan of action and management. All questions answered  Follow up to review HST results and treatment plan.   I spent a total of 36 minutes reviewing chart data, face-to-face evaluation with the patient, counseling and coordination of care as detailed above.    Miasia Crabtree, M.D.  Sleep Medicine [facility]

## 2023-10-31 NOTE — Patient Instructions (Addendum)
 SABRA

## 2023-11-15 ENCOUNTER — Encounter

## 2023-11-15 DIAGNOSIS — G4733 Obstructive sleep apnea (adult) (pediatric): Secondary | ICD-10-CM

## 2023-11-22 DIAGNOSIS — G4733 Obstructive sleep apnea (adult) (pediatric): Secondary | ICD-10-CM | POA: Diagnosis not present

## 2023-12-05 ENCOUNTER — Encounter: Payer: Self-pay | Admitting: Sleep Medicine

## 2024-01-02 ENCOUNTER — Other Ambulatory Visit: Payer: Self-pay | Admitting: Internal Medicine

## 2024-01-02 DIAGNOSIS — N951 Menopausal and female climacteric states: Secondary | ICD-10-CM

## 2024-01-03 NOTE — Telephone Encounter (Signed)
 Reuqested by interface surescripts. Medication dose discontinued 10/13/23. Requested Prescriptions  Refused Prescriptions Disp Refills   venlafaxine  XR (EFFEXOR -XR) 75 MG 24 hr capsule [Pharmacy Med Name: Venlafaxine  HCl ER 75 MG Oral Capsule Extended Release 24 Hour] 90 capsule 0    Sig: TAKE 1 CAPSULE BY MOUTH ONCE DAILY WITH BREAKFAST     Psychiatry: Antidepressants - SNRI - desvenlafaxine & venlafaxine  Failed - 01/03/2024  1:34 PM      Failed - Lipid Panel in normal range within the last 12 months    Cholesterol  Date Value Ref Range Status  04/13/2023 142 <200 mg/dL Final   LDL Cholesterol (Calc)  Date Value Ref Range Status  04/13/2023 62 mg/dL (calc) Final    Comment:    Reference range: <100 . Desirable range <100 mg/dL for primary prevention;   <70 mg/dL for patients with CHD or diabetic patients  with > or = 2 CHD risk factors. [facility]RA LDL-C is now calculated using the Martin-Hopkins  calculation, which is a validated novel method providing  better accuracy than the Friedewald equation in the  estimation of LDL-C.  Gladis APPLETHWAITE et al. SANDREA. 7986;689(80): 2061-2068  (http://education.[facility].com/faq/FAQ164)    HDL  Date Value Ref Range Status  04/13/2023 59 > OR = 50 mg/dL Final   Triglycerides  Date Value Ref Range Status  04/13/2023 120 <150 mg/dL Final         Passed - Cr in normal range and within 360 days    Creat  Date Value Ref Range Status  04/13/2023 0.82 0.60 - 1.00 mg/dL Final         Passed - Last BP in normal range    BP Readings from Last 1 Encounters:  10/31/23 110/70         Passed - Valid encounter within last 6 months    Recent Outpatient Visits           2 months ago Essential (primary) hypertension   [facility] Bern[facility] Fend, [place]   8 months ago Essential (primary) hypertension   [facility] Bernardo Fend, [place]

## 2024-02-05 ENCOUNTER — Encounter: Payer: Self-pay | Admitting: Sleep Medicine

## 2024-02-05 DIAGNOSIS — G4733 Obstructive sleep apnea (adult) (pediatric): Secondary | ICD-10-CM

## 2024-04-12 ENCOUNTER — Ambulatory Visit: Admitting: Internal Medicine

## 2024-04-17 ENCOUNTER — Ambulatory Visit: Admitting: Internal Medicine

## 2024-10-31 ENCOUNTER — Ambulatory Visit
